# Patient Record
Sex: Female | Born: 1980 | Race: White | Hispanic: No | Marital: Single | Smoking: Former smoker
Health system: Southern US, Community
[De-identification: ages and names within clinical notes are randomized; demographics above are authoritative.]

## PROBLEM LIST (undated history)

## (undated) DIAGNOSIS — G43909 Migraine, unspecified, not intractable, without status migrainosus: Secondary | ICD-10-CM

## (undated) DIAGNOSIS — E669 Obesity, unspecified: Secondary | ICD-10-CM

## (undated) DIAGNOSIS — H04123 Dry eye syndrome of bilateral lacrimal glands: Secondary | ICD-10-CM

## (undated) DIAGNOSIS — F419 Anxiety disorder, unspecified: Secondary | ICD-10-CM

## (undated) DIAGNOSIS — F410 Panic disorder [episodic paroxysmal anxiety] without agoraphobia: Secondary | ICD-10-CM

## (undated) DIAGNOSIS — K859 Acute pancreatitis without necrosis or infection, unspecified: Secondary | ICD-10-CM

## (undated) DIAGNOSIS — G589 Mononeuropathy, unspecified: Secondary | ICD-10-CM

## (undated) DIAGNOSIS — E662 Morbid (severe) obesity with alveolar hypoventilation: Secondary | ICD-10-CM

## (undated) DIAGNOSIS — R0600 Dyspnea, unspecified: Secondary | ICD-10-CM

## (undated) HISTORY — DX: Morbid (severe) obesity due to excess calories: E66.01

## (undated) HISTORY — PX: EYE SURGERY: SHX253

## (undated) HISTORY — DX: Morbid (severe) obesity with alveolar hypoventilation: E66.2

## (undated) HISTORY — DX: Migraine, unspecified, not intractable, without status migrainosus: G43.909

## (undated) HISTORY — DX: Dry eye syndrome of bilateral lacrimal glands: H04.123

## (undated) HISTORY — DX: Obesity, unspecified: E66.9

---

## 2002-08-24 HISTORY — PX: OTHER SURGICAL HISTORY: SHX169

## 2002-08-24 HISTORY — PX: FRACTURE SURGERY: SHX138

## 2002-09-23 ENCOUNTER — Inpatient Hospital Stay (HOSPITAL_COMMUNITY): Admission: EM | Admit: 2002-09-23 | Discharge: 2002-09-26 | Payer: Self-pay | Admitting: Emergency Medicine

## 2002-09-23 ENCOUNTER — Encounter: Payer: Self-pay | Admitting: Emergency Medicine

## 2002-09-23 ENCOUNTER — Encounter: Payer: Self-pay | Admitting: Orthopedic Surgery

## 2002-11-23 ENCOUNTER — Ambulatory Visit (HOSPITAL_COMMUNITY): Admission: RE | Admit: 2002-11-23 | Discharge: 2002-11-23 | Payer: Self-pay | Admitting: Orthopedic Surgery

## 2002-11-28 ENCOUNTER — Encounter: Admission: RE | Admit: 2002-11-28 | Discharge: 2003-02-26 | Payer: Self-pay | Admitting: Orthopedic Surgery

## 2003-02-27 ENCOUNTER — Encounter: Admission: RE | Admit: 2003-02-27 | Discharge: 2003-03-15 | Payer: Self-pay | Admitting: Orthopedic Surgery

## 2003-09-26 ENCOUNTER — Emergency Department (HOSPITAL_COMMUNITY): Admission: EM | Admit: 2003-09-26 | Discharge: 2003-09-26 | Payer: Self-pay

## 2003-09-28 ENCOUNTER — Inpatient Hospital Stay (HOSPITAL_COMMUNITY): Admission: EM | Admit: 2003-09-28 | Discharge: 2003-10-06 | Payer: Self-pay | Admitting: Emergency Medicine

## 2003-10-11 ENCOUNTER — Ambulatory Visit (HOSPITAL_COMMUNITY): Admission: RE | Admit: 2003-10-11 | Discharge: 2003-10-11 | Payer: Self-pay | Admitting: Internal Medicine

## 2003-11-06 ENCOUNTER — Encounter (INDEPENDENT_AMBULATORY_CARE_PROVIDER_SITE_OTHER): Payer: Self-pay | Admitting: *Deleted

## 2003-11-06 ENCOUNTER — Ambulatory Visit (HOSPITAL_COMMUNITY): Admission: RE | Admit: 2003-11-06 | Discharge: 2003-11-07 | Payer: Self-pay | Admitting: Surgery

## 2004-05-05 ENCOUNTER — Emergency Department (HOSPITAL_COMMUNITY): Admission: EM | Admit: 2004-05-05 | Discharge: 2004-05-05 | Payer: Self-pay | Admitting: *Deleted

## 2004-08-24 DIAGNOSIS — K859 Acute pancreatitis without necrosis or infection, unspecified: Secondary | ICD-10-CM

## 2004-08-24 HISTORY — PX: CHOLECYSTECTOMY: SHX55

## 2004-08-24 HISTORY — DX: Acute pancreatitis without necrosis or infection, unspecified: K85.90

## 2005-01-21 ENCOUNTER — Emergency Department (HOSPITAL_COMMUNITY): Admission: EM | Admit: 2005-01-21 | Discharge: 2005-01-21 | Payer: Self-pay | Admitting: Family Medicine

## 2005-01-29 ENCOUNTER — Emergency Department (HOSPITAL_COMMUNITY): Admission: EM | Admit: 2005-01-29 | Discharge: 2005-01-29 | Payer: Self-pay | Admitting: Emergency Medicine

## 2005-02-23 ENCOUNTER — Emergency Department (HOSPITAL_COMMUNITY): Admission: EM | Admit: 2005-02-23 | Discharge: 2005-02-23 | Payer: Self-pay | Admitting: Emergency Medicine

## 2005-02-27 ENCOUNTER — Emergency Department (HOSPITAL_COMMUNITY): Admission: EM | Admit: 2005-02-27 | Discharge: 2005-02-27 | Payer: Self-pay | Admitting: Family Medicine

## 2005-03-08 ENCOUNTER — Emergency Department (HOSPITAL_COMMUNITY): Admission: EM | Admit: 2005-03-08 | Discharge: 2005-03-08 | Payer: Self-pay | Admitting: Emergency Medicine

## 2005-05-12 IMAGING — CT CT ABDOMEN W/ CM
1 series · 16 of 32 positions shown, 20 images · IV contrast (omnipaque)
Comparison: none

CLINICAL DATA: Left lower quadrant pain.  
 CT ABDOMEN WITH CONTRAST  
 150 cc Omnipaque 300 IV.  Oral contrast was also administered.  No comparison.  
 The liver, spleen, and kidneys are normal.  The pancreas is diffusely swollen.  There is some edema in the mesentery around the pancreas, particularly around the head of the pancreas.  There is some retroperitoneal fluid bilaterally.  The findings suggest acute pancreatitis.  There is no pseudocyst.
 IMPRESSION
 Pancreatic swelling with peripancreatic fluid suggesting acute pancreatitis. 
 CT PELVIS WITH CONTRAST
 There is a small amount of free fluid in the pelvis.  There is no bowel thickening or adenopathy.  The bowel is nondilated.
 Free fluid in the pelvis.

[Series 2: routine abdomen · axial · 0.98mm/px · z∈[-517,-87]mm · 16 of 127 slices shown, 20 images]
[im 9/127  soft-tissue]
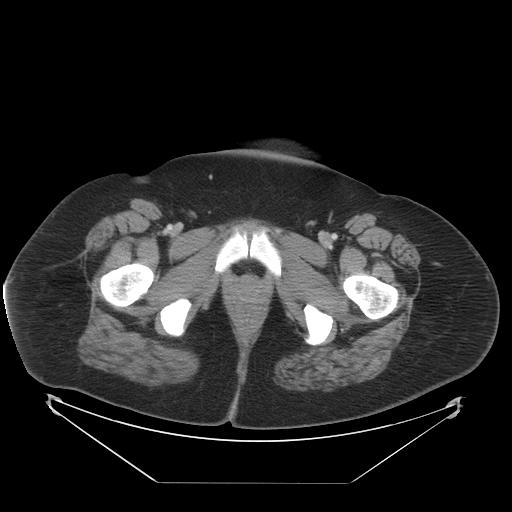
[im 9/127  bone]
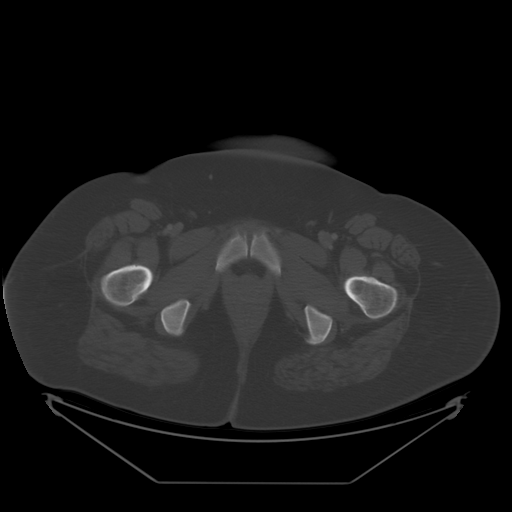
[im 17/127  soft-tissue]
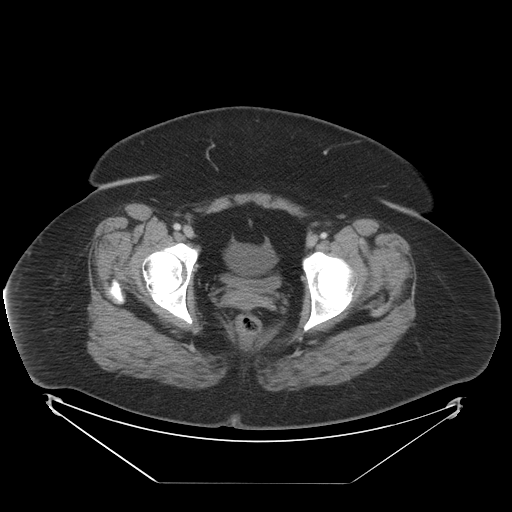
[im 25/127  soft-tissue]
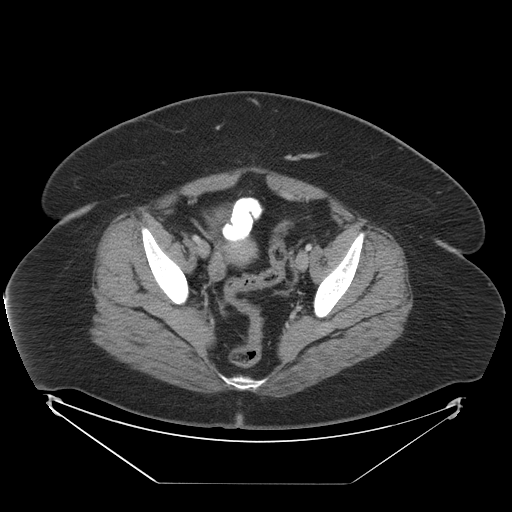
[im 33/127  soft-tissue]
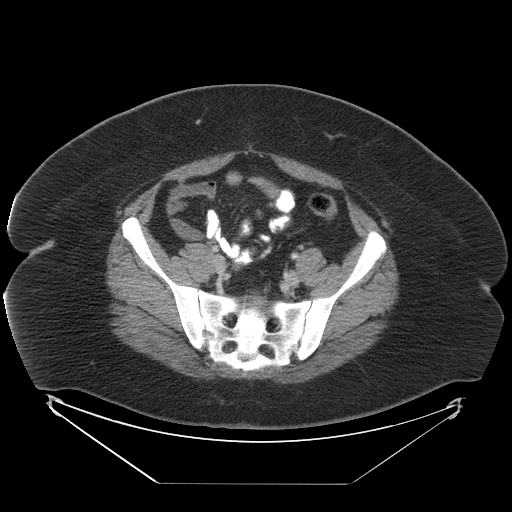
[im 41/127  soft-tissue]
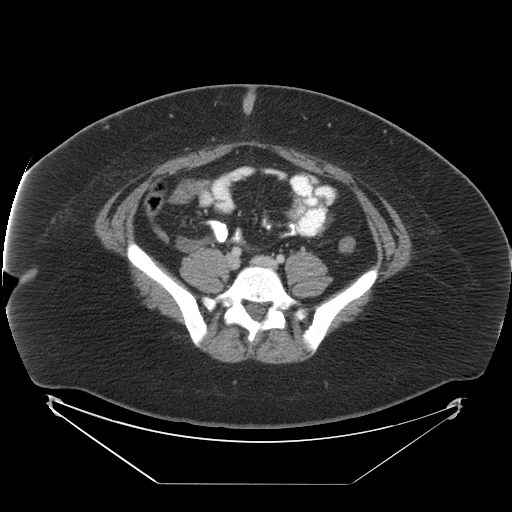
[im 49/127  soft-tissue]
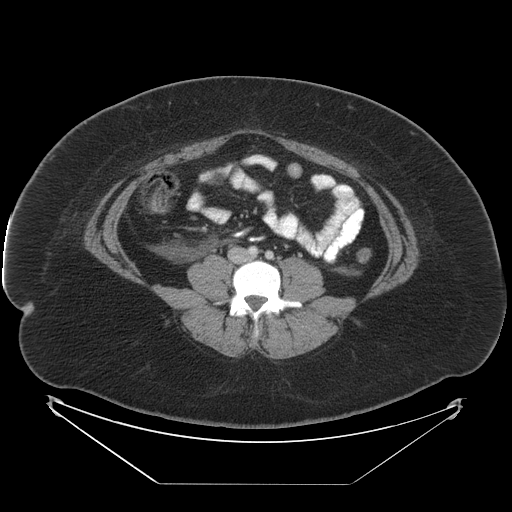
[im 57/127  soft-tissue]
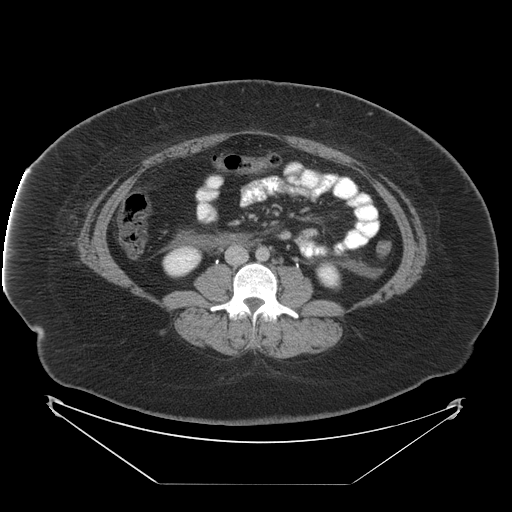
[im 70/127  soft-tissue]
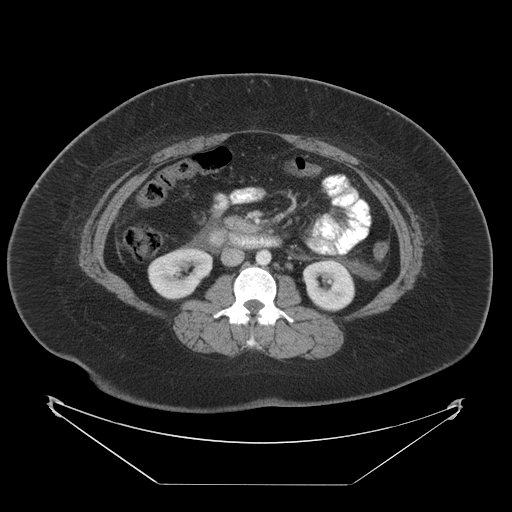
[im 78/127  soft-tissue]
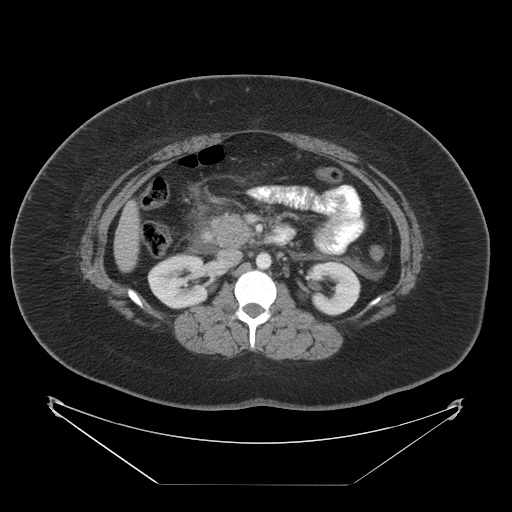
[im 78/127  bone]
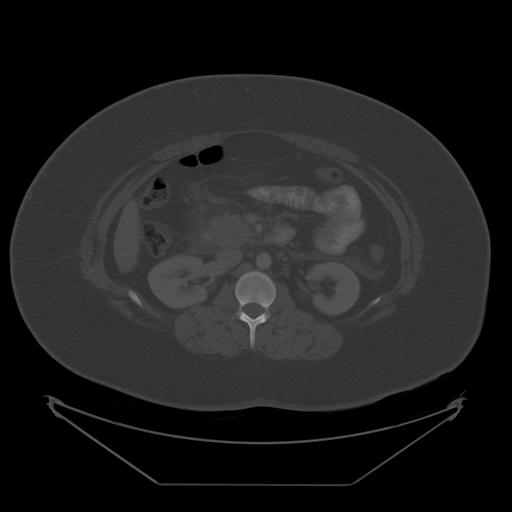
[im 86/127  soft-tissue]
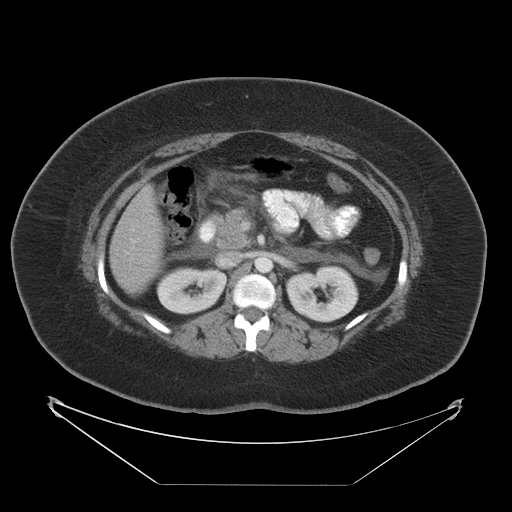
[im 94/127  soft-tissue]
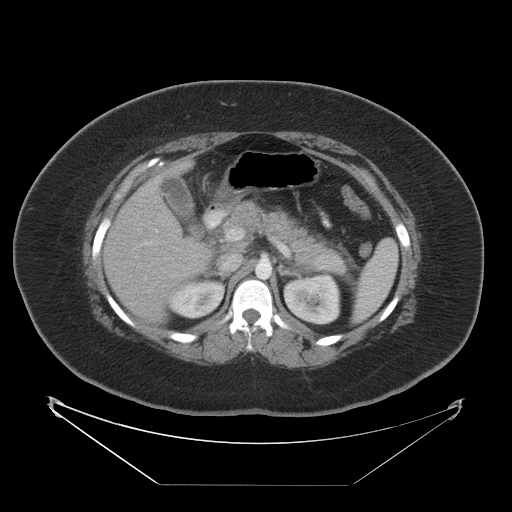
[im 102/127  soft-tissue]
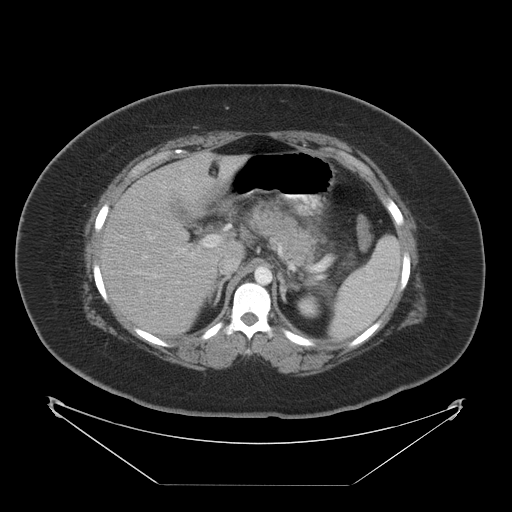
[im 110/127  soft-tissue]
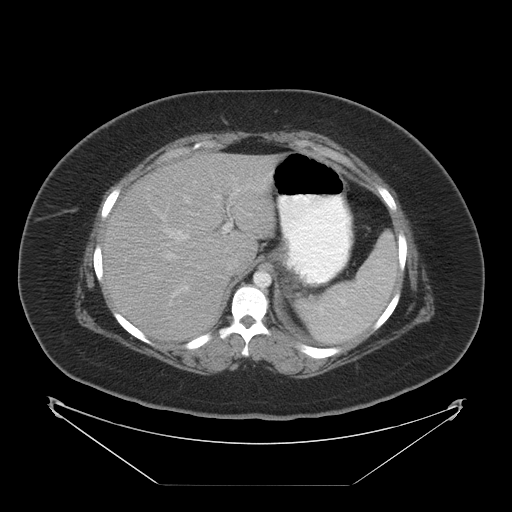
[im 110/127  lung]
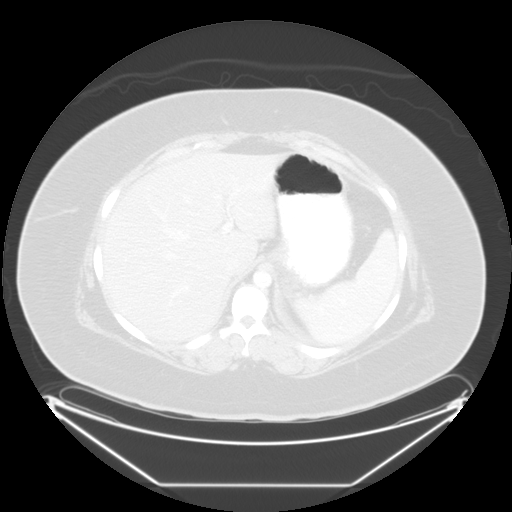
[im 114/127  lung]
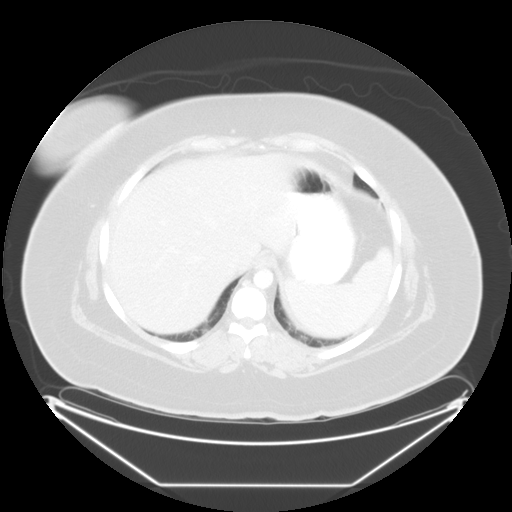
[im 118/127  soft-tissue]
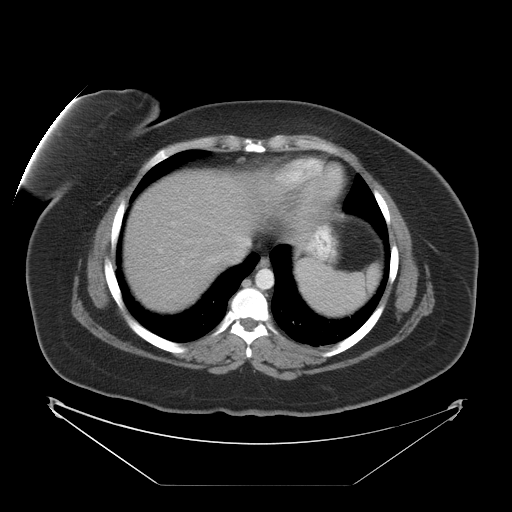
[im 118/127  lung]
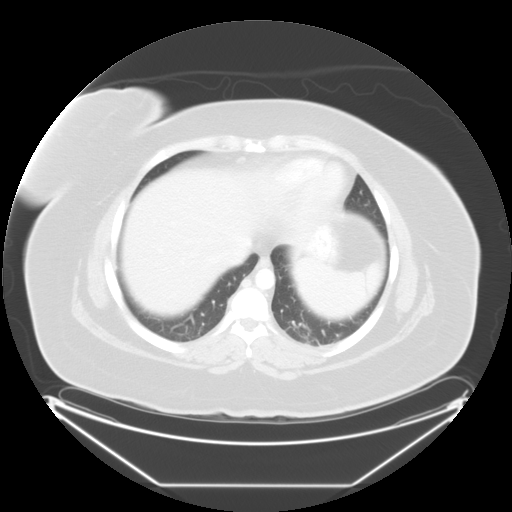
[im 122/127  lung]
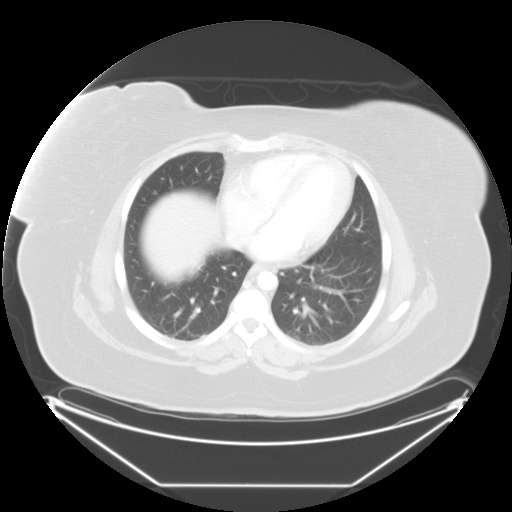

[16 of 32 positions shown; findings below may reference images not displayed]

## 2005-05-13 IMAGING — US US ABDOMEN COMPLETE
1 series · 14 of 25 positions shown · non-contrast
Comparison: none

CLINICAL DATA: Pancreatitis.  Question gallstones. 
ABDOMINAL SONOGRAM

[Series 1: unknown · 0.38mm/px · 14 of 49 slices shown]
[im 1/49]
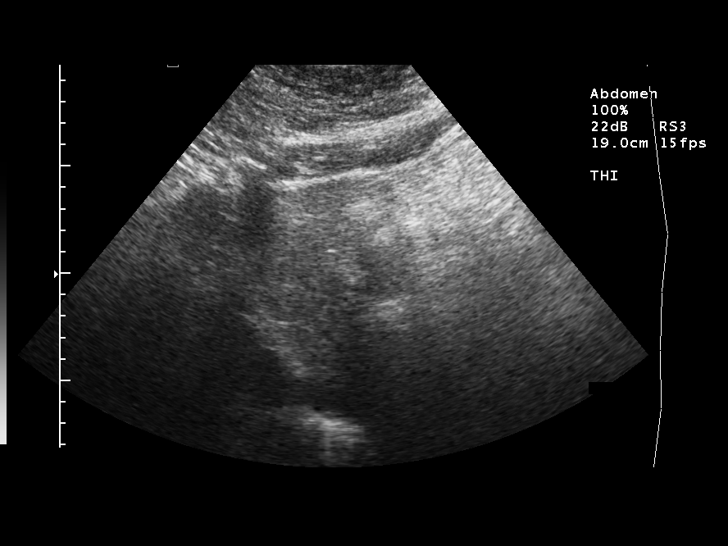
[im 5/49]
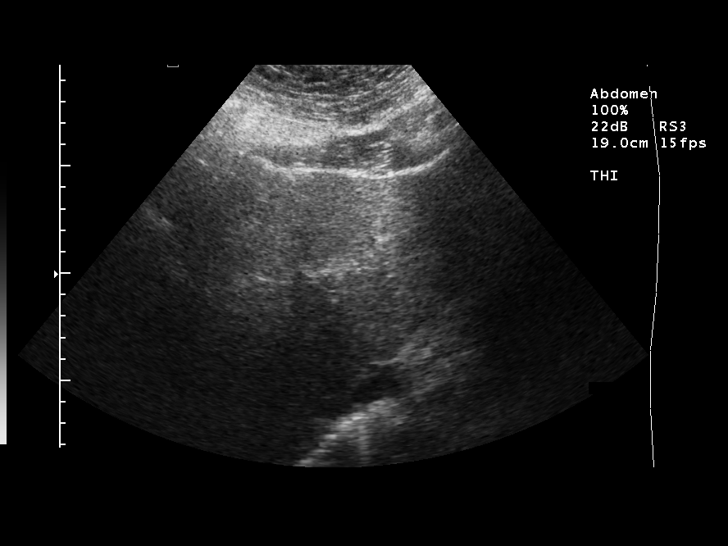
[im 9/49]
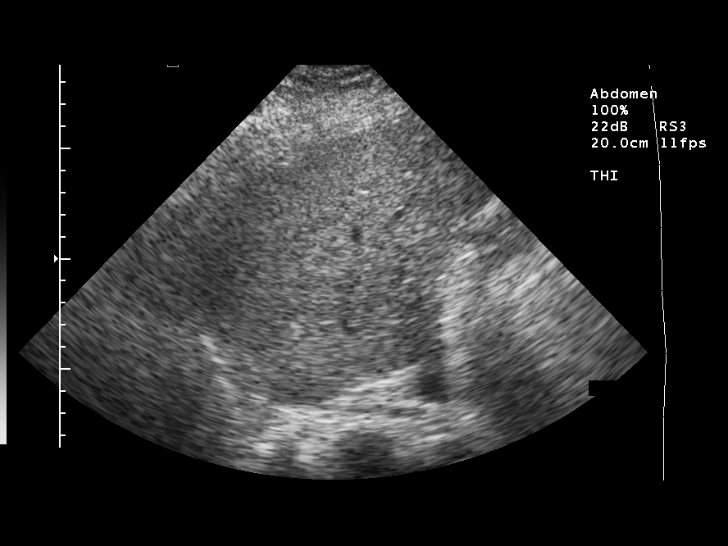
[im 13/49]
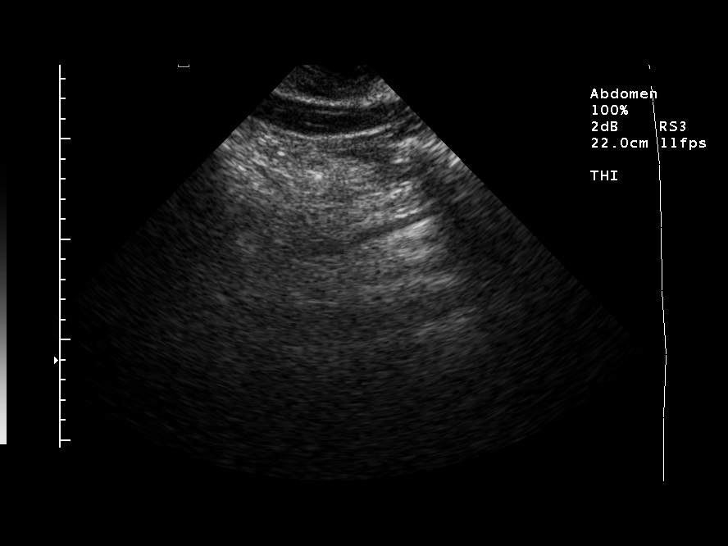
[im 17/49]
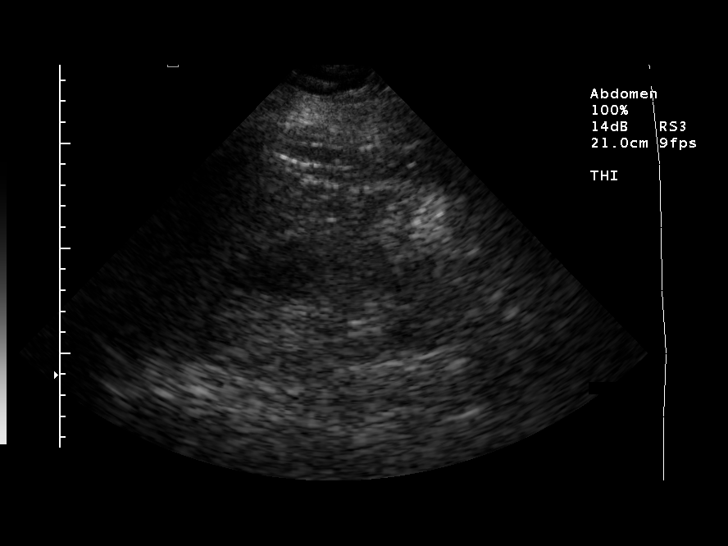
[im 19/49]
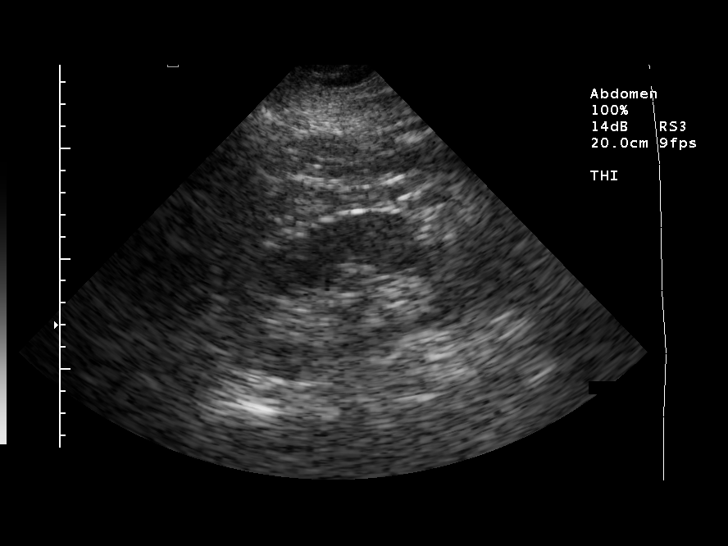
[im 23/49]
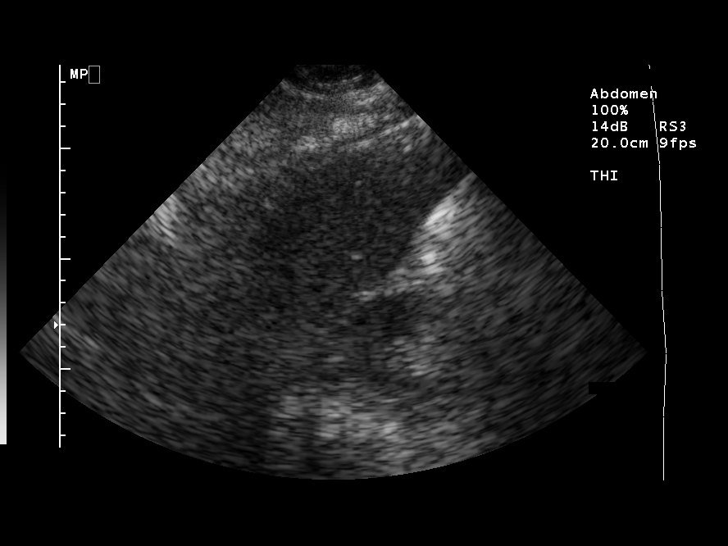
[im 27/49]
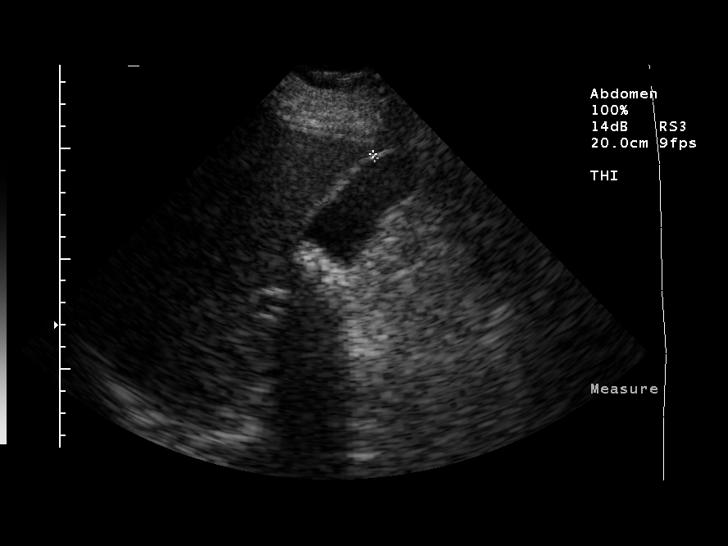
[im 31/49]
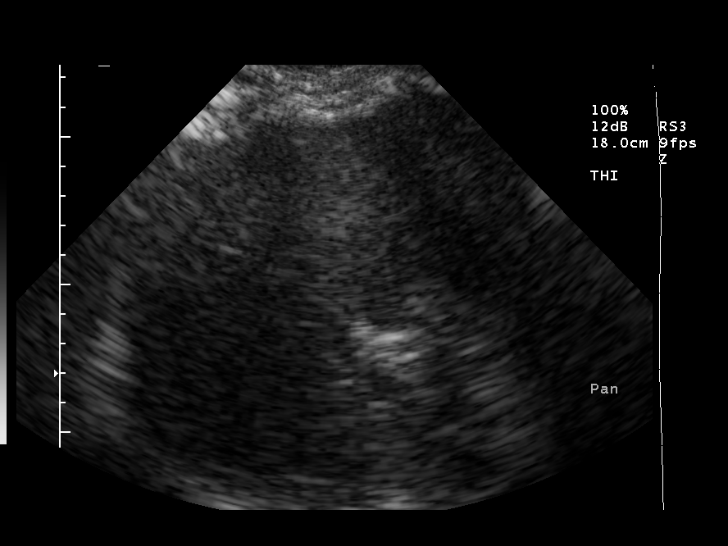
[im 33/49]
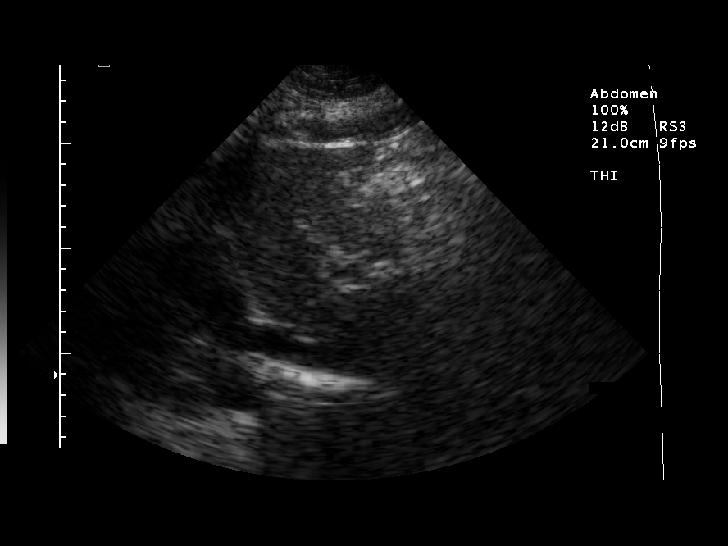
[im 37/49]
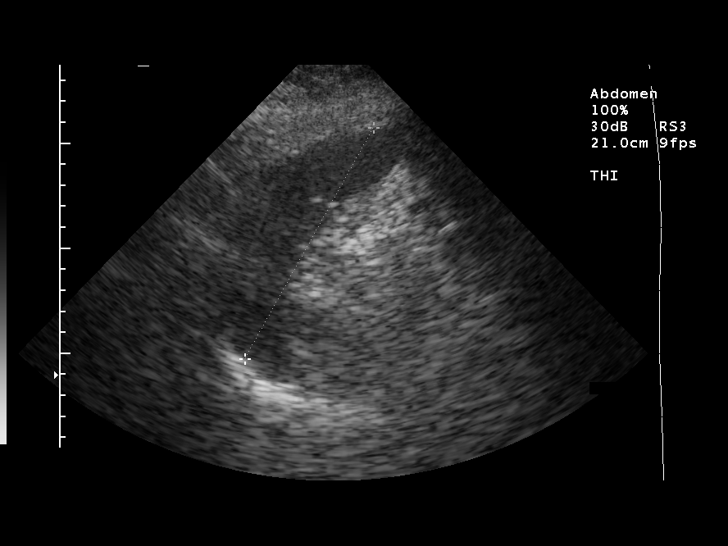
[im 41/49]
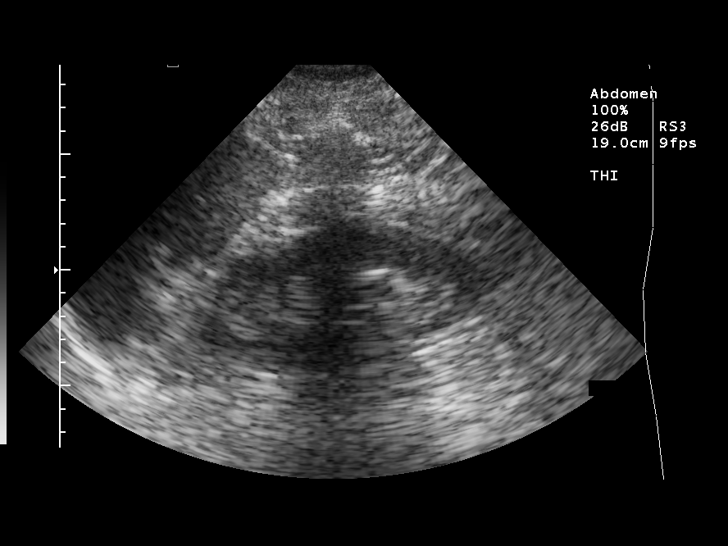
[im 45/49]
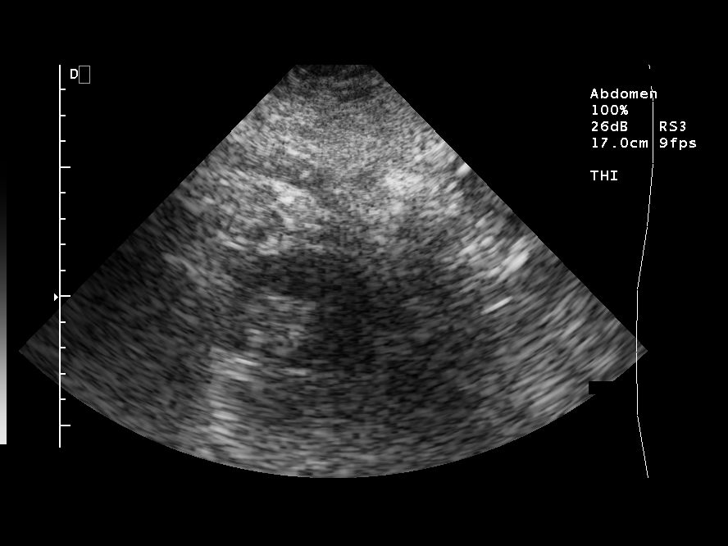
[im 49/49]
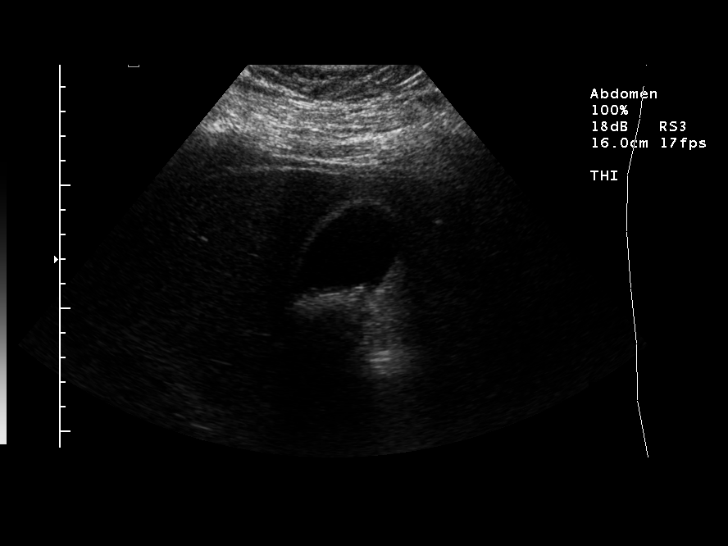

[14 of 25 positions shown; findings below may reference images not displayed]

FINDINGS: Fatty infiltration of the liver without focal hepatic lesion or intrahepatic duct dilatation.  Multiple small mobile gallstones without gallbladder wall thickening with gallbladder wall measuring 2.0 cm.  No pericholecystic fluid detected.  It is possible that a small stone entered the common bile duct system contributing to the patient?s pancreatitis, however, this is not demonstrated on the present exam.  The common bile duct measures 4.4 mm.  
Because of overlying bowel gas, there is markedly limited evaluation of the pancreas and abdominal aorta as well as inferior vena cava. 
The right kidney measures 11.3 cm and the left kidney 11.9 cm without focal mass or hydronephrosis.  The spleen is unremarkable.
IMPRESSION
Multiple small mobile gallstones without gallbladder wall thickening.  Please see above.

## 2005-07-27 ENCOUNTER — Encounter: Admission: RE | Admit: 2005-07-27 | Discharge: 2005-07-27 | Payer: Self-pay | Admitting: Orthopedic Surgery

## 2005-10-19 ENCOUNTER — Emergency Department (HOSPITAL_COMMUNITY): Admission: EM | Admit: 2005-10-19 | Discharge: 2005-10-19 | Payer: Self-pay | Admitting: Family Medicine

## 2006-06-10 ENCOUNTER — Emergency Department (HOSPITAL_COMMUNITY): Admission: EM | Admit: 2006-06-10 | Discharge: 2006-06-10 | Payer: Self-pay | Admitting: Emergency Medicine

## 2006-08-04 ENCOUNTER — Emergency Department (HOSPITAL_COMMUNITY): Admission: EM | Admit: 2006-08-04 | Discharge: 2006-08-04 | Payer: Self-pay | Admitting: Emergency Medicine

## 2008-01-06 ENCOUNTER — Emergency Department (HOSPITAL_COMMUNITY): Admission: EM | Admit: 2008-01-06 | Discharge: 2008-01-06 | Payer: Self-pay | Admitting: Family Medicine

## 2008-06-17 ENCOUNTER — Emergency Department (HOSPITAL_COMMUNITY): Admission: EM | Admit: 2008-06-17 | Discharge: 2008-06-17 | Payer: Self-pay | Admitting: Emergency Medicine

## 2008-10-30 ENCOUNTER — Emergency Department (HOSPITAL_COMMUNITY): Admission: EM | Admit: 2008-10-30 | Discharge: 2008-10-30 | Payer: Self-pay | Admitting: Emergency Medicine

## 2010-06-27 ENCOUNTER — Emergency Department (HOSPITAL_COMMUNITY): Admission: EM | Admit: 2010-06-27 | Discharge: 2010-06-27 | Payer: Self-pay | Admitting: Emergency Medicine

## 2010-12-04 LAB — URINALYSIS, ROUTINE W REFLEX MICROSCOPIC
Bilirubin Urine: NEGATIVE
Hgb urine dipstick: NEGATIVE
Ketones, ur: NEGATIVE mg/dL
Protein, ur: NEGATIVE mg/dL
Urobilinogen, UA: 0.2 mg/dL (ref 0.0–1.0)

## 2010-12-04 LAB — POCT I-STAT, CHEM 8
BUN: 8 mg/dL (ref 6–23)
Chloride: 104 mEq/L (ref 96–112)
Creatinine, Ser: 0.9 mg/dL (ref 0.4–1.2)
Potassium: 3.9 mEq/L (ref 3.5–5.1)
Sodium: 140 mEq/L (ref 135–145)

## 2010-12-04 LAB — POCT CARDIAC MARKERS
CKMB, poc: <1 ng/mL — ABNORMAL LOW (ref 1.0–8.0)
Myoglobin, poc: 41.2 ng/mL (ref 12–200)

## 2011-01-09 NOTE — Op Note (Signed)
[name], French                         ACCOUNT NO.:  [id number]   MEDICAL RECORD NO.:  [id number]                   PATIENT TYPE:  OIB   LOCATION:  6122                                 FACILITY:  [facility]   PHYSICIAN:  Thornton Park. Daphine Deutscher, M.D.             DATE OF BIRTH:  1980/10/21   DATE OF PROCEDURE:  11/06/2003  DATE OF DISCHARGE:  11/07/2003                                 OPERATIVE REPORT   PREOPERATIVE DIAGNOSIS:  Biliary pancreatitis from gallstones.   POSTOPERATIVE DIAGNOSIS:  Biliary pancreatitis from gallstones.  Status post  resolution of pancreatitis.   OPERATION PERFORMED:  Laparoscopic cholecystectomy with intraoperative  cholangiogram.   SURGEON:  Thornton Park. Daphine Deutscher, M.D.   ASSISTANT:  Velora Heckler, M.D.   ANESTHESIA:  General endotracheal.   INDICATIONS FOR PROCEDURE:  Sarah French is a 30 year old lady who had been  previously admitted by the teaching service with biliary pancreatitis.  She  was seen back in the office at which time informed consent was obtained  regarding laparoscopy as well as open cholecystectomy.   DESCRIPTION OF PROCEDURE:  She was taken back to room 16 on November 06, 2003  and given general anesthesia.  The abdomen was prepped with Betadine and  draped sterilely.  A longitudinal incision was made down into her umbilicus  and a Hasson cannula was inserted without difficulty. The abdomen was  insufflated and three ports were placed in the upper abdomen.  The  gallbladder was grasped, elevated and I stripped away adhesions to the  fundus and infundibulum.  Went down to dissect out a lot of adhesions to the  Calot's triangle but was able to identify her cystic duct, cystic artery and  clipped the cystic artery and put a clip up on the gallbladder.  I incised  the cystic duct and inserted a Reddick catheter and took a dynamic  cholangiogram which showed filling of the common duct, intrahepatic ducts  and flow into the duodenum.  We used  the old Siemens' C-arms.  They had  difficulty saving images but this to my dynamic interpretation showed the  anatomy adequately and did not show any evidence of distal common duct  obstruction.  However, I was unable to discern a pancreatic duct at reflux.   The cystic duct was then triple clipped, divided.  Cystic artery was triple  clipped, divided and the gallbladder was removed with the hook  electrocautery without entering it.  Posterior branches were clipped and  then the gallbladder was detached from the gallbladder bed and it was  brought out in a bag through the umbilicus.  I went in and looked at the  gallbladder bed.  No bleeding or bile leaks were noted.  Umbilical port was  prepared with a figure-of-eight suture of 0 Vicryl.  The patient seemed to  tolerate the procedure well and was taken to the recovery room in  satisfactory condition.  Thornton Park Daphine Deutscher, M.D.    MBM/MEDQ  D:  11/06/2003  T:  11/07/2003  Job:  [id number]

## 2011-01-09 NOTE — Discharge Summary (Signed)
Sarah French, Sarah French                         ACCOUNT NO.:  [id number]   MEDICAL RECORD NO.:  [id number]                   PATIENT TYPE:  INP   LOCATION:  6713                                 FACILITY:  [facility]   PHYSICIAN:  Sarah Asal, MD             DATE OF BIRTH:  1981-05-18   DATE OF ADMISSION:  09/27/2003  DATE OF DISCHARGE:  10/06/2003                                 DISCHARGE SUMMARY   The patient does not have a continuity doctor.  She sees Sarah French p.r.n.   CONSULTANTS:  Sarah French of surgery.   DISCHARGE DIAGNOSES:  1. Pancreatitis.  2. Obesity.  3. Gallstones.  4. Gastroesophageal reflux disease.  5. History of urinary tract infection.  6. Status post repair of ankle fracture in the past.   DISCHARGE MEDICATIONS:  1. Phenergan 25-mg tabs, 1 tab q.6h p.r.n. nausea.  2. Scopolamine patch applied behind the ear for 72 hours p.r.n. nausea.  3. Allegra D one tab p.o. daily.   DISPOSITION AND FOLLOWUP:  1. The patient has an appointment for a CT scan scheduled for February 17 at     11 o'clock.  2. The patient has an appointment scheduled with Dr. Daphine French at [facility].  The patient was to call to schedule that appointment     approximately 1 week after discharge.  3. The patient was told to call [phone] to follow up with Dr. Laurell French approximately 3 to 4 weeks after discharge.   PROCEDURES PERFORMED:  1. MRCP which showed multiple 1 to 3-mm gallstones independently in the     gallbladder neck.  No sign of stone obstruction in the ductal system.  No     ductal dilation.  Did show a retroperitoneal edema, particularly on the     left consistent with resolving pancreatitis.  No evidence of pancreatic     pseudocyst.  Also showed small pleural effusions and a small amount of     ascites.  That was done on October 02, 2003.  2. Abdominal ultrasound done on February 4 showed multiple small gallstones     without  gallbladder Asselin thickening.  3. CT of the pelvis with contrast was done on February 3 which showed free     fluid in the pelvis.  CT of the abdomen was also done on February 3 which     once again showed free fluid in the pelvis.  The patient had chest x-rays     on February 2 and February 5 which showed no active disease but later a     left-sided subpulmonic effusion.   CONSULTATIONS:  The patient was seen by Dr. Luretha French of surgery to  evaluate her for possible cholecystectomy.   HISTORY AND PHYSICAL:  She is a 30 year old morbidly obese female with no  significant past medical history who presented to the  ER on August 26, 2003,  complaining of chest and back pain.  Her lipase was normal at that time. She  was discharged home with a script for Darvocet and Flexeril.  She returned  on the day of admission, September 27, 2003, to the emergency department  complaining of stomach pain, sharp in quality, rated at a 10 out of 10,  constant with no aggravating factors.  Minimal alleviation with pain  medications that she had been getting. She had no recent fever, chills,  weight changes.  A CT obtained in the emergency department showed  pancreatitis. She did have nausea and vomiting.  No diarrhea, no melena, no  hematochezia.  She had had a decreased appetite in the past 24 hours prior  to  admission.   LABORATORY DATA:  At the time of admission her labs showed a white count of  11.4, a hemoglobin 13.5, platelets 269,000, ANC 9.8, MCV 87.2.  Sodium of  140, potassium 4, chloride 107, CO2 of 29, BUN 11, creatinine 0.8, glucose  115, total bili of 0.8, alk phos 53, AST 51, ALT 31, total protein 6.2,  albumin of 3.7, and calcium of 8.8.  The labs just mentioned were from  September 26, 2003, the date prior to admission.   The day of admission she had a lipase of 1498.  She had a sodium of 136,  potassium 3.9, chloride 105, CO2 of 27, glucose 122, BUN 8, creatinine 0.7,  total bili 1.4,  alk phos of 70, AST of 238, ALT of 315, total protein of  5.8, albumin 3.4, and calcium 8.4.  She also had a white count of 11.4  hemoglobin 13.5, platelets of 269,000.  She had no fecal occult blood at the  time of admission.  She also had a UA which showed 3 to 5 white blood cells,  many bacteria, and moderate leukocyte esterase at the time of admission.   HOSPITAL COURSE:  Problem #1:  Pancreatitis.  At the time of admission this  was not thought to be due to alcohol since she does not have a heavy alcohol  use history.  We also did not think at the beginning that it was due to  gallstones because there were no obstructing stones seen on scan, although  there were multiple stones seen in the gallbladder.  Given her marked  obesity, we at first thought that she might have hypertriglyceridemia, so we  checked that.  When she first came in, she was placed n.p.o. and her pain  was controlled.  She had no _____________ factors, so her chance of  mortality from her pancreatitis was 0.9% when she first came in.   Over the course of her hospital stay, she continued to have significant pain  issues.  She was relieved Dilaudid for awhile and then was transitioned to  morphine. Her lipase continued to trend down but for many days she continued  to have significant epigastric tenderness.  Upon further  reflection, it was  thought that perhaps her pancreatitis was due to gallstones as her  triglycerides came back as 58.  So, this was not a cause, although she did  have multiple, multiple gallstones seen in the gallbladder.  It was thought  that she probably did have an obstruction and that it had passed.  MRCP was  performed to insure that there was no sludging or stone that was not seen on  plain film and CT, and no stone or obstruction was ever found  on MRCP.   An acute hepatitis panel was also done which was negative.  H. pylori was negative.  Surgery was consulted for possible cholecystectomy.   Dr. Daphine French  saw the patient and recommended to do laparoscopic cholecystectomy when the  edema surrounding her pancreas had resolved.  He recommended repeating a CT  scan approximately a week after pain had resolved and proceeding from there  with an outpatient elective laparoscopic cholecystectomy.   On October 05, 2003, approximately 8 days into the hospital stay, the  patient's pain began to resolve and she began to get her appetite back.  Her  diet was advanced and she was tolerating liquids at the time of discharge on  October 06, 2003.  Nutrition was consulted and per their recommendation,  and per the recommendation of Dr. Daphine French, the patient will be kept on a low-  fat liquid diet until such time as she can have her laparoscopic  cholecystectomy.   Problem #2:  Urinary tract infection.  The patient was given IV Cipro for 3  days which treated that and had no other complications ith her urinary tract  infection during the course of her hospital stay.   Problem #3:  Morbid obesity.  The patient's weight was discussed with her  both by myself and with Dr. Daphine French.  She desires to lose weight, but does  not really know how to do so and has not tried very many things in the past.  We recommended nutrition to see her on an outpatient basis and help her with  diet and lifestyle changes.   Problem #4:  Acute sinusitis.  The patient developed acute sinusitis during  this hospitalization and was covered with Zosyn and Afrin for 3 days after  which that resolved after a 3-day course.   Problem #5:  Diarrhea.  The patient did develop diarrhea during the course  of this hospitalization.  A C. diff was checked and was negative.  The  patient was afebrile and never mounted a white count.  It was thought that  her diarrhea was likely secondary to multiple factors including the liquid  diet that she had and her pancreatitis and was resolving at the time of  discharge.   Labs at the time of  discharge included CBC with a white count of 6.6,  hemoglobin 10, hematocrit 30, platelet count 314,000.  Sodium 142, potassium  3.6, chloride 106, CO2 of 29, glucose 109, BUN 5, creatinine 0.7, and  calcium of 8.2.                                                Sarah Asal, MD    CW/MEDQ  D:  10/09/2003  T:  10/09/2003  Job:  [id number]

## 2011-01-09 NOTE — Discharge Summary (Signed)
   [name], French                         ACCOUNT NO.:  [id number]   MEDICAL RECORD NO.:  [id number]                   PATIENT TYPE:  INP   LOCATION:  5019                                 FACILITY:  [facility]   PHYSICIAN:  Burnard Bunting, M.D.                 DATE OF BIRTH:  05/03/1981   DATE OF ADMISSION:  09/22/2002  DATE OF DISCHARGE:  09/26/2002                                 DISCHARGE SUMMARY   DISCHARGE DIAGNOSES:  Left ankle fracture dislocation.   SECONDARY DIAGNOSIS:  None.   HISTORY OF PRESENT ILLNESS:  Sarah French is a 30 year old patient who  slipped on the ice on the day of admission. Complains of left ankle pain.  Denies any other orthopedic complaints.   HOSPITAL COURSE:  The patient was admitted to the orthopedic service. She  underwent left ankle reduction with open reduction internal fixation of the  fibula and syndesmosis on 09/23/02. She tolerated the procedure well without  any complications. She was transferred to the recovery room in stable  condition. She was mobilized with physical therapy and started on Coumadin  for DVT prophylaxis. Toes were sensate and perfused. Postoperative x-rays  showed good reduction of the fracture in the mortis. She was slow to  mobilize with physical therapy. She was off the IV pain medications on  09/25/02. She was seen by physical therapy on all days of hospitalization and  was safe for discharge on 09/26/02. She will follow up with me in one week to  check the incision.   DISCHARGE MEDICATIONS:  1. Coumadin.  2. Percocet.  3. Robaxin.   ACTIVITY:  She will maintain a nonweightbearing status for approximately two  to three months. She will require syndesmotic screw removal in the future.                                               Burnard Bunting, M.D.    GSD/MEDQ  D:  10/24/2002  T:  10/25/2002  Job:  [id number]

## 2011-01-09 NOTE — Op Note (Signed)
NAME:  Sarah French, Sarah French                         ACCOUNT NO.:  [id number]   MEDICAL RECORD NO.:  [id number]                   PATIENT TYPE:  INP   LOCATION:  5019                                 FACILITY:  [facility]   PHYSICIAN:  Burnard Bunting, M.D.                 DATE OF BIRTH:  [birth year]   DATE OF PROCEDURE:  09/23/2002  DATE OF DISCHARGE:                                 OPERATIVE REPORT   PREOPERATIVE DIAGNOSIS:  Left ankle fracture dislocation with disruption of  syndesmosis in the lateral malleolar fracture.   POSTOPERATIVE DIAGNOSIS:  Left ankle fracture dislocation with disruption of  syndesmosis in the lateral malleolar fracture.   PROCEDURE:  1. Left ankle closed reduction of dislocation.  2. Open reduction and internal fixation of lateral malleolus fracture.  3. Open reduction and internal fixation of syndesmosis.   SURGEON:  Burnard Bunting, M.D.   ANESTHESIA:  General endotracheal.   ESTIMATED BLOOD LOSS:  50 mL.   DRAINS:  None.   TOURNIQUET TIME:  Approximately one hour at 300 mmHg.   PROCEDURE IN DETAIL:  The patient was brought to the operating room where  general endotracheal anesthesia was induced.  Preoperative antibiotics were  administered.  The left foot was prepped with Duraprep solution and draped  in a sterile manner.  The fracture was reduced with a combination of  traction and inward/outward reduction.  The ankle fracture was reduced.  The  medial malleolus was intact.  The ankle was highly unstable.  At this time  the leg was elevated and exsanguinated with the Esmarch wrap and tourniquet  was inflated.  The operative field was covered with Ioban.  A lateral  approach to the malleolus was utilized.  The skin and subcutaneous tissue  were sharply divided.  The superficial peroneal nerve was identified.  The  posterior branch crossing the acetabula was mobilized and protected.  A lag  screw was placed across the distal fracture fragment which had a  coronal  split.  This gave excellent, turned the two distal pieces into one solid  fragment.  There was significant comminution in the region slightly superior  to the syndesmosis.  A rotational alignment of the distal fragment was  difficult to ascertain because of the comminution proximally.  Under  fluoroscopic guidance the mortise was recreated and the fibular length was  restored.  A one-third tibial locking plate was then applied with good  fixation achieved in the distal and proximal fragments.  At this time the  syndesmosis was highly unstable.  The syndesmosis was reduced and two 3.5  cortical screws were placed in the proximal syndesmosis.  Proximal holes  were over drilled with 3.5 drill bit.  Good compression and restoration of  the mortise was maintained.  At this time ankle was taken through a range of  motion and found to have good ankle dorsal flexion with stable  fracture  alignment.  The tourniquet was released, the  incision was irrigated thoroughly.  The incision was then closed using  interrupted inverted 2-0 Vicryl sutures followed by running 3-0 pullout  Prolene.  The patient was placed in a posterior splint.  She tolerated the  procedure well without immediate complications.                                               Burnard Bunting, M.D.    GSD/MEDQ  D:  09/24/2002  T:  09/24/2002  Job:  [id number]

## 2011-01-09 NOTE — Consult Note (Signed)
NAMELORICE, LAFAVE                         ACCOUNT NO.:  [id number]   MEDICAL RECORD NO.:  [id number]                   PATIENT TYPE:  INP   LOCATION:  6713                                 FACILITY:  [facility]   PHYSICIAN:  Thornton Park. Daphine Deutscher, M.D.             DATE OF BIRTH:  1981/01/13   DATE OF CONSULTATION:  10/03/2003  DATE OF DISCHARGE:                                   CONSULTATION   REASON FOR CONSULTATION:  Biliary pancreatitis.   HISTORY:  This is a 30 year old, morbidly obese, white female who was  referred in with abdominal pain.  She had had a previous bout of abdominal  pain about a month ago, August 26, 2003, when she was seen in the emergency  room with chest and back pain.  She had a lipase that was normal at that  time.  She was seen in the ED on September 27, 2003, with abdominal pain, but  no fever or chills.  CT scan obtained in the emergency department was  consistent with pancreatitis.  The patient complained of nausea and  vomiting, but no diarrhea.   ALLERGIES:  1. The patient gets a rash with AZITHROMYCIN.  2. Rash with PERCOCET.   PAST MEDICAL HISTORY:  1. Morbid obesity.  2. GERD for which she takes Nexium 40 mg daily.  3. She has previously injured her ankle which has effected her mobility.   SOCIAL HISTORY:  She denies tobacco use and denies alcohol use.  She is  single and is an unemployed person.  She lives with her sister.   FAMILY HISTORY:  Her parents are both alive.   On admission, she was found to have some edema of her peripancreatic tail  and along with that had an elevated lipase and amylase with a lipase of 1498  and an amylase of 1341 with elevated transaminases, normal alkaline  phosphatase, and a total bilirubin of 1.4.   In addition, she had a normal cholesterol and a normal triglyceride level.   She subsequently has undergone a gallbladder ultrasound which showed no  evidence of gallbladder Andreoli thickening.  She has also had a  small free-  layering left pleural effusion noted on her x-rays.  MRCP was negative for  intraductal stones.  She does have 1-3 mm gallstones layering dependently in  the gallbladder neck.  There was retroperitoneal edema, particularly on the  left, consistent with resolving pancreatitis.  No evidence of pseudocyst.  Small pleural effusions and a small amount of ascites.   IMPRESSION:  Gallstone pancreatitis, resolving with normalization of enzymes  so far.  However, the patient currently is having some abdominal pain and  nausea with liquids.   Would consider repeat CT scan in a few days.  Since MR yesterday showed that  she still has edema, I would follow her clinically for the next few days and  then consider repeat CT scan.  Once  the edema has diminished, hopefully  elective laparoscopic cholecystectomy can be pursued.                                               Thornton Park Daphine Deutscher, M.D.    MBM/MEDQ  D:  10/03/2003  T:  10/03/2003  Job:  [id number]

## 2011-01-09 NOTE — H&P (Signed)
   NAME:  Sarah French, GOOSBY                         ACCOUNT NO.:  [id number]   MEDICAL RECORD NO.:  [id number]                   PATIENT TYPE:  EMS   LOCATION:  MAJO                                 FACILITY:  [facility]   PHYSICIAN:  Burnard Bunting, M.D.                 DATE OF BIRTH:  August 29, 1980   DATE OF ADMISSION:  09/22/2002  DATE OF DISCHARGE:                                HISTORY & PHYSICAL   CHIEF COMPLAINT:  Left ankle pain.   HISTORY OF PRESENT ILLNESS:  The patient is a 30 year old female who tripped  on the ice today at 10:45 p.m.  She complains of left ankle pain.  She  denies any other orthopedic complaints.   PAST MEDICAL HISTORY:  Negative.   PAST SURGICAL HISTORY:  Negative.   MEDICATIONS:  None.   ALLERGIES:  None.   EXAM:  She is alert and oriented times three.  Chest is clear to  auscultation.  Heart beats regular rhythm.  Abdominal exam is benign.  There  is no left hip or knee pain with range of motion.  The left lower extremity  has medial skin tinting.  Dorsalis pedis pulse 2+/4.  Sensation is intact on  the dorsal and plantar aspect of the foot.  X-ray is poor quality.  It shows  two laterals which do show a fibular fracture and tibiotalar dislocation.  Labs are pending.   IMPRESSION:  Tibiotalar dislocation with fibular fracture and probable  medial malleolar fracture.   PLAN:  I considered reduction in the ER because of the medial skin tinting.  This was unsuccessful.  Therefore, we will take her to surgery for open  reduction and internal fixation.  Risks and benefits were discussed.  Primary risks included infection, nonunion, malunion, DVT, need for more  surgery, nerve and vessel damage.  These risks are not all-inclusive.  The  patient understands the risks and benefits and will proceed.                                               Burnard Bunting, M.D.    GSD/MEDQ  D:  09/23/2002  T:  09/23/2002  Job:  [id number]

## 2011-05-20 LAB — POCT RAPID STREP A: Streptococcus, Group A Screen (Direct): POSITIVE — AB

## 2011-05-25 LAB — POCT CARDIAC MARKERS: Myoglobin, poc: 27.7

## 2012-08-24 DIAGNOSIS — G589 Mononeuropathy, unspecified: Secondary | ICD-10-CM

## 2012-08-24 HISTORY — DX: Mononeuropathy, unspecified: G58.9

## 2015-02-20 ENCOUNTER — Encounter (HOSPITAL_BASED_OUTPATIENT_CLINIC_OR_DEPARTMENT_OTHER): Payer: Self-pay | Admitting: *Deleted

## 2015-02-20 ENCOUNTER — Emergency Department (HOSPITAL_BASED_OUTPATIENT_CLINIC_OR_DEPARTMENT_OTHER)
Admission: EM | Admit: 2015-02-20 | Discharge: 2015-02-20 | Disposition: A | Discharge status: Home / Self Care | Payer: Medicaid Other | Attending: Emergency Medicine | Admitting: Emergency Medicine

## 2015-02-20 DIAGNOSIS — L600 Ingrowing nail: Secondary | ICD-10-CM | POA: Insufficient documentation

## 2015-02-20 DIAGNOSIS — M79675 Pain in left toe(s): Secondary | ICD-10-CM | POA: Diagnosis present

## 2015-02-20 MED ORDER — DOXYCYCLINE HYCLATE 100 MG PO CAPS: 100.0000 mg | ORAL_CAPSULE | Freq: Two times a day (BID) | ORAL | Status: DC

## 2015-02-20 NOTE — ED Notes (Signed)
Presents with pain in Left great toe for approx 3 weeks

## 2015-02-20 NOTE — Discharge Instructions (Signed)
Ingrown Toenail An ingrown toenail occurs when the sharp edge of your toenail grows into the skin. Causes of ingrown toenails include toenails clipped too far back or poorly fitting shoes. Activities involving sudden stops (basketball, tennis) causing "toe jamming" may lead to an ingrown nail. HOME CARE INSTRUCTIONS   Soak the whole foot in warm soapy water for 20 minutes, 3 times per day.  You may lift the edge of the nail away from the sore skin by wedging a small piece of cotton under the corner of the nail. Be careful not to dig (traumatize) and cause more injury to the area.  Wear shoes that fit well. While the ingrown nail is causing problems, sandals may be beneficial.  Trim your toenails regularly and carefully. Cut your toenails straight across, not in a curve. This will prevent injury to the skin at the corners of the toenail.  Keep your feet clean and dry.  Crutches may be helpful early in treatment if walking is painful.  Antibiotics, if prescribed, should be taken as directed.  Return for a wound check in 2 days or as directed.  Only take over-the-counter or prescription medicines for pain, discomfort, or fever as directed by your caregiver. SEEK IMMEDIATE MEDICAL CARE IF:   You have a fever.  You have increasing pain, redness, swelling, or heat at the wound site.  Your toe is not better in 7 days. If conservative treatment is not successful, surgical removal of a portion or all of the nail may be necessary. MAKE SURE YOU:   Understand these instructions.  Will watch your condition.  Will get help right away if you are not doing well or get worse. Document Released: 08/07/2000 Document Revised: 11/02/2011 Document Reviewed: 08/01/2008 ExitCare Patient Information 2015 [employer]. This information is not intended to replace advice given to you by your health care provider. Make sure you discuss any questions you have with your health care provider.  

## 2015-02-20 NOTE — ED Provider Notes (Signed)
CSN: [id number]     Arrival date & time 02/20/15  1020 History  This chart was scribed for Glynn Octave, MD by Leone Payor, ED Scribe. This patient was seen in room MH03/MH03 and the patient's care was started 10:59 AM.    Chief Complaint  Patient presents with  . Toe Pain    The history is provided by the patient. No language interpreter was used.     HPI Comments: Sarah French is a 34 y.o. female who presents to the Emergency Department complaining of constant, gradually worsened left great toe pain with associated minimal swelling for the past couple of weeks. She denies recent injury or trauma to the affected area. She denies bleeding or drainage. She reports a history of ingrown toenails. She denies fever, vomiting.   History reviewed. No pertinent past medical history. Past Surgical History  Procedure Laterality Date  . Fracture surgery     History reviewed. No pertinent family history. History  Substance Use Topics  . Smoking status: Never Smoker   . Smokeless tobacco: Not on file  . Alcohol Use: No   OB History    No data available     Review of Systems  A complete 10 system review of systems was obtained and all systems are negative except as noted in the HPI and PMH.    Allergies  Zithromax  Home Medications   Prior to Admission medications   Medication Sig Start Date End Date Taking? Authorizing Provider  doxycycline (VIBRAMYCIN) 100 MG capsule Take 1 capsule (100 mg total) by mouth 2 (two) times daily. 02/20/15   Glynn Octave, MD   BP 151/88 mmHg  Pulse 70  Temp(Src) 98.5 F (36.9 C) (Oral)  Resp 18  Ht  (1.626 m)  Wt 294 lb (133.358 kg)  BMI 50.44 kg/m2  SpO2 100%  LMP 02/06/2015 (Approximate) Physical Exam  Constitutional: She is oriented to person, place, and time. She appears well-developed and well-nourished. No distress.  HENT:  Head: Normocephalic and atraumatic.  Mouth/Throat: Oropharynx is clear and moist. No oropharyngeal  exudate.  Eyes: Conjunctivae and EOM are normal. Pupils are equal, round, and reactive to light.  Neck: Normal range of motion. Neck supple.  No meningismus.  Cardiovascular: Normal rate, regular rhythm, normal heart sounds and intact distal pulses.   No murmur heard. Pulmonary/Chest: Effort normal and breath sounds normal. No respiratory distress.  Abdominal: Soft. There is no tenderness. There is no rebound and no guarding.  Musculoskeletal: Normal range of motion. She exhibits no edema or tenderness.  Neurological: She is alert and oriented to person, place, and time. No cranial nerve deficit. She exhibits normal muscle tone. Coordination normal.  No ataxia on finger to nose bilaterally. No pronator drift. 5/5 strength throughout. CN 2-12 intact. Negative Romberg. Equal grip strength. Sensation intact. Gait is normal.   Skin: Skin is warm. No erythema.  Tenderness to palpation of the lateral nail fold of the left great toe. No fluctuance or erythema. No appreciable paronychia.   Psychiatric: She has a normal mood and affect. Her behavior is normal.  Nursing note and vitals reviewed.   ED Course  Procedures (including critical care time)  DIAGNOSTIC STUDIES: Oxygen Saturation is 100% on RA, normal by my interpretation.    COORDINATION OF CARE: 11:02 AM Discussed treatment plan with pt at bedside and pt agreed to plan.  Labs Review Labs Reviewed - No data to display  Imaging Review No results found.   EKG Interpretation  None      MDM   Final diagnoses:  Ingrown left big toenail   3 weeks of atraumatic left toe pain worse with ambulation. Denies fever or vomiting. She is not diabetic.  Exam shows probable ingrown toenail of the left great toe. There is no appreciable abscess or paronychia.  She'll be given empiric antibiotics, referral to podiatry, hard sole shoe. Instructed on warm soaks.  Return precautions given.    I personally performed the services described in  this documentation, which was scribed in my presence. The recorded information has been reviewed and is accurate.   Glynn OctaveStephen Shaneeka Scarboro, MD 02/20/15 1122

## 2015-02-20 NOTE — ED Notes (Signed)
Pain is worse when ambulating, when resting and foot elevated no pain present, has taken ibuprofen to assist with pain control

## 2015-02-22 ENCOUNTER — Encounter: Payer: Self-pay | Admitting: Podiatry

## 2015-02-22 ENCOUNTER — Ambulatory Visit (INDEPENDENT_AMBULATORY_CARE_PROVIDER_SITE_OTHER): Payer: Medicaid Other | Admitting: Podiatry

## 2015-02-22 VITALS — BP 143/83 | HR 78 | Ht 64.0 in | Wt 294.0 lb

## 2015-02-22 DIAGNOSIS — M79673 Pain in unspecified foot: Secondary | ICD-10-CM

## 2015-02-22 DIAGNOSIS — L600 Ingrowing nail: Secondary | ICD-10-CM | POA: Diagnosis not present

## 2015-02-22 DIAGNOSIS — M79606 Pain in leg, unspecified: Secondary | ICD-10-CM

## 2015-02-22 DIAGNOSIS — M79605 Pain in left leg: Secondary | ICD-10-CM

## 2015-02-22 NOTE — Patient Instructions (Signed)
Ingrown nail surgery was done on left great toe lateral border. Follow soaking instruction.  Some redness and drainage is expected. Call the office if the area gets feverish with increased redness and drainage.  

## 2015-02-22 NOTE — Progress Notes (Signed)
Subjective: 34 year old female presents complaining of having ingrown nail on both great toes in regular base.  Objective: Dermatologic: Painful ingrown nail both great toe L>R. No drainage noted.  Pedal pulses are palpable bilateral. No edema or erythema.  No gross deformities in osseous structures.  Assessment: Painful ingrown nails both great toes.  Plan:  Ingrown nail surgery was done on left great toe lateral border. Affected left great toe was anesthetized with total 5ml mixture of 50/50 0.5% Marcaine plain and 1% Xylocaine plain. Affected lateral nail border was reflected with a nail elevator and excised with nail nipper. Proximal nail matrix tissue was cauterized with Phenol soaked cotton applicator x 4 and neutralized with Alcohol soaked cotton applicator. The wound was dressed with Amerigel ointment dressing. Home care instructions and supply dispensed.  Return in 1 week for follow up.

## 2015-02-27 ENCOUNTER — Encounter: Payer: Self-pay | Admitting: Podiatry

## 2015-02-27 ENCOUNTER — Ambulatory Visit (INDEPENDENT_AMBULATORY_CARE_PROVIDER_SITE_OTHER): Payer: Medicaid Other | Admitting: Podiatry

## 2015-02-27 DIAGNOSIS — Z9889 Other specified postprocedural states: Secondary | ICD-10-CM

## 2015-02-27 NOTE — Patient Instructions (Signed)
Post nail surgery left great toe doing well. Continue to soak till pain stops. May need ingrown nail surgery on right side next month.

## 2015-02-27 NOTE — Progress Notes (Signed)
Doing well following nail surgery on left great toe. Some residual redness. Continue to soak another week. May need ingrown nail surgery on right great toe. Discussed custom orthotics on hyper pronating feet.

## 2015-03-01 ENCOUNTER — Encounter: Payer: Medicaid Other | Admitting: Podiatry

## 2015-05-14 ENCOUNTER — Encounter: Payer: Self-pay | Admitting: Diagnostic Neuroimaging

## 2015-05-14 ENCOUNTER — Ambulatory Visit (INDEPENDENT_AMBULATORY_CARE_PROVIDER_SITE_OTHER): Payer: Medicaid Other | Admitting: Diagnostic Neuroimaging

## 2015-05-14 VITALS — BP 125/81 | HR 74 | Ht 63.5 in | Wt 305.4 lb

## 2015-05-14 DIAGNOSIS — R0683 Snoring: Secondary | ICD-10-CM

## 2015-05-14 DIAGNOSIS — R5382 Chronic fatigue, unspecified: Secondary | ICD-10-CM | POA: Diagnosis not present

## 2015-05-14 DIAGNOSIS — G5712 Meralgia paresthetica, left lower limb: Secondary | ICD-10-CM

## 2015-05-14 DIAGNOSIS — G43009 Migraine without aura, not intractable, without status migrainosus: Secondary | ICD-10-CM

## 2015-05-14 MED ORDER — TOPIRAMATE 50 MG PO TABS: 50.0000 mg | ORAL_TABLET | Freq: Two times a day (BID) | ORAL | Status: DC

## 2015-05-14 NOTE — Progress Notes (Signed)
[facility]  PATIENT: Sarah French DOB: 07-14-1981  REFERRING CLINICIAN: Lysle Dingwall HISTORY FROM: patient  REASON FOR VISIT: new consult    HISTORICAL  CHIEF COMPLAINT:  Chief Complaint  Patient presents with  . Headache    rm 6,  New Patient    HISTORY OF PRESENT ILLNESS:   34 year old right-handed female here for evaluation of headaches and left leg numbness.  Since middle school patient has had intermittent headaches, frontal, temporal, severe headaches with photophobia, phonophobia, nausea. Sometimes patient would have to leave school early, late on the dark quiet room. I high school these headaches have resolved.  Around 2008 patient started to develop new type of pressure, frontal, headaches sometimes sharp shooting pain, sometimes throbbing, which gradually increased over time. Now patient having daily headaches. 2 times per week she has severe headaches with nausea, leading patient to go lay down. 2014 she was about a by neurologist who diagnosed her with migraine headaches. Patient started on topiramate, topiramate extended release, gabapentin, sumatriptan without relief.  Patient is also developed numbness and tingling and burning sensation in the left lateral thigh over last few years. Patient was diagnosed with meralgia paresthetica and advised to proceed with weight loss strategies and gabapentin.  Patient also has snoring and daytime fatigue. She had a sleep study 2 years ago which showed mild sleep apnea, but not qualifying for CPAP therapy. Symptoms have worsened since that time. She would like to have another sleep study now.  Patient currently has PCP who is her OB/GYN. She wants help with weight loss, it is on the wait list at the bariatric surgery clinic in [place].    REVIEW OF SYSTEMS: Full 14 system review of systems performed and notable only for allergies weight gain fatigue snoring joint pain joint swelling aching muscles decreased  energy dizziness headache numbness sleepiness snoring.  ALLERGIES: Allergies  Allergen Reactions  . Zithromax [Azithromycin] Itching    HOME MEDICATIONS: Outpatient Prescriptions Prior to Visit  Medication Sig Dispense Refill  . doxycycline (VIBRAMYCIN) 100 MG capsule Take 1 capsule (100 mg total) by mouth 2 (two) times daily. 20 capsule 0   No facility-administered medications prior to visit.    PAST MEDICAL HISTORY: No past medical history on file.  PAST SURGICAL HISTORY: Past Surgical History  Procedure Laterality Date  . Fracture surgery Left 2004    ankle  . Eye surgery      as infant and child    FAMILY HISTORY: Family History  Problem Relation Age of Onset  . Hypertension Mother   . Hypertension Father   . Arthritis Sister     SOCIAL HISTORY:  Social History   Social History  . Marital Status: Single    Spouse Name: N/A  . Number of Children: 1  . Years of Education: N/A   Occupational History  . student     [employer]   Social History Main Topics  . Smoking status: Former Games developer  . Smokeless tobacco: Never Used  . Alcohol Use: No  . Drug Use: No  . Sexual Activity: Not on file   Other Topics Concern  . Not on file   Social History Narrative   Lives at home with parents, son   Caffeine use-      PHYSICAL EXAM  GENERAL EXAM/CONSTITUTIONAL: Vitals:  Filed Vitals:   05/14/15 1054  BP: 125/81  Pulse: 74  Height: 5' 3.5" (1.613 m)  Weight: 305 lb 6.4 oz (138.529 kg)  Body mass index is 53.24 kg/(m^2).  Visual Acuity Screening   Right eye Left eye Both eyes  Without correction:     With correction: 20/30 20/20      Patient is in no distress; well developed, nourished and groomed; neck is supple  CARDIOVASCULAR:  Examination of carotid arteries is normal; no carotid bruits  Regular rate and rhythm, no murmurs  Examination of peripheral vascular system by observation and palpation is normal  EYES:  Ophthalmoscopic exam of  optic discs and posterior segments is normal; no papilledema or hemorrhages  MUSCULOSKELETAL:  Gait, strength, tone, movements noted in Neurologic exam below  NEUROLOGIC: MENTAL STATUS:  No flowsheet data found.  awake, alert, oriented to person, place and time  recent and remote memory intact  normal attention and concentration  language fluent, comprehension intact, naming intact,   fund of knowledge appropriate  CRANIAL NERVE:   2nd - no papilledema on fundoscopic exam  2nd, 3rd, 4th, 6th - pupils equal and reactive to light, visual fields full to confrontation, extraocular muscles intact, no nystagmus  5th - facial sensation symmetric  7th - facial strength symmetric  8th - hearing intact  9th - palate elevates symmetrically, uvula midline  11th - shoulder shrug symmetric  12th - tongue protrusion midline  MOTOR:   normal bulk and tone, full strength in the BUE, BLE  SENSORY:   normal and symmetric to light touch, pinprick, temperature, vibration; HYPERSENSITIVE IN LEFT LATERAL THIGH  COORDINATION:   finger-nose-finger, fine finger movements normal  REFLEXES:   deep tendon reflexes TRACE and symmetric  GAIT/STATION:   narrow based gait; romberg is negative    DIAGNOSTIC DATA (LABS, IMAGING, TESTING) - I reviewed patient records, labs, notes, testing and imaging myself where available.  Lab Results  Component Value Date   HGB 13.9 10/30/2008   HCT 41.0 10/30/2008      Component Value Date/Time   NA 140 10/30/2008 1825   K 3.9 10/30/2008 1825   CL 104 10/30/2008 1825   GLUCOSE 90 10/30/2008 1825   BUN 8 10/30/2008 1825   CREATININE 0.9 10/30/2008 1825   No results found for: CHOL, HDL, LDLCALC, LDLDIRECT, TRIG, CHOLHDL No results found for: ZOXW9U No results found for: VITAMINB12 No results found for: TSH  10/30/08 CXR [I reviewed images myself and agree with interpretation. -VRP]  - Suboptimal inspiration. No acute cardiopulmonary  disease.    ASSESSMENT AND PLAN  34 y.o. year old female here with long history of migraine headaches, now with chronic migraine, tension headaches and chronic daily headaches. Patient also at risk for sleep apnea and associated headaches. No papilledema at this time which makes pseudotumor cerebri less likely, but not totally excluded.  Advised conservative mgmt for meralgia paresthetica. Long term weight loss is very important for patient's symptoms.   Dx:   Migraine without aura and without status migrainosus, not intractable  Severe obesity (BMI >= 40) - Plan: Ambulatory referral to Sleep Studies  Meralgia paresthetica of left side  Snoring  Chronic fatigue - Plan: Ambulatory referral to Sleep Studies    PLAN: - check sleep study to eval for OSA (last study in 2014 showed AHI 2.2) - trial of TPX - may consider LP in future - nutrition, exercise and weight loss strategies reviewed and encouraged  Orders Placed This Encounter  Procedures  . Ambulatory referral to Sleep Studies   Meds ordered this encounter  Medications  . topiramate (TOPAMAX) 50 MG tablet    Sig:  Take 1 tablet (50 mg total) by mouth 2 (two) times daily.    Dispense:  60 tablet    Refill:  12   Return in about 3 months (around 08/13/2015).    Suanne Marker, MD 05/14/2015, 11:45 AM Certified in Neurology, Neurophysiology and Neuroimaging  [facility] [address], [address] [place], [place] [address] [phone]

## 2015-05-14 NOTE — Patient Instructions (Addendum)
Try topiramate  at bedtime. After 2 weeks increase to twice a day. Drink plenty of water with topiramate.  I will setup sleep study referral.  Establish with primary care. Ask for referral to nutritionist and weight loss clinic.

## 2015-06-04 ENCOUNTER — Ambulatory Visit (INDEPENDENT_AMBULATORY_CARE_PROVIDER_SITE_OTHER): Payer: Medicaid Other | Admitting: Neurology

## 2015-06-04 ENCOUNTER — Encounter: Payer: Self-pay | Admitting: Neurology

## 2015-06-04 VITALS — BP 126/68 | HR 80 | Resp 20 | Ht 63.5 in | Wt 304.0 lb

## 2015-06-04 DIAGNOSIS — R0683 Snoring: Secondary | ICD-10-CM

## 2015-06-04 DIAGNOSIS — R51 Headache: Secondary | ICD-10-CM

## 2015-06-04 DIAGNOSIS — R0689 Other abnormalities of breathing: Secondary | ICD-10-CM | POA: Diagnosis not present

## 2015-06-04 DIAGNOSIS — G4730 Sleep apnea, unspecified: Secondary | ICD-10-CM | POA: Diagnosis not present

## 2015-06-04 DIAGNOSIS — R519 Headache, unspecified: Secondary | ICD-10-CM

## 2015-06-04 NOTE — Patient Instructions (Signed)

## 2015-06-04 NOTE — Progress Notes (Signed)
SLEEP MEDICINE CLINIC   Provider:  Melvyn Novas, M D  Referring Provider: Suanne Marker, MD Primary Care Physician:  Okey Dupre, MD GYN   Chief Complaint  Patient presents with  . New Patient (Initial Visit)    sleep study, snoring, fatigued, tired when she wakes up, headaches, rm 11, alone     Chief complaint according to patient : The patient reports not having restorative or refreshing sleep and waking with headaches. Headaches do not wake her.  HPI:  Sarah French is a caucasian, right handed , single mother of a son,  34 y.o. female , seen here as a referral  from Dr. Marjory Lies for a sleep evaluation, consultation.   Sarah French is referred by my partner Dr. Joycelyn Schmid, for a sleep evaluation. He was mainly concerned because the patient described frequent almost daily headaches severe headaches that were there at the time when she woke up in the morning. Her headaches can progress over the days that can also resolve. Since middle school she has had intermittent headaches with a location to the frontal temporal area on both sides of the head. Sometimes she will even have occipital pain. She would leave school sometimes early because of the headaches. She would retired to a dark and quiet room. During high school years these headaches get better and resolved. Her headaches were associated with nausea but she reports not to have had to vomit. She just felt queasy and she had photophobia and phonophobia associated with it. About 2008 her headaches resumed. Sometimes with a sharp shooting pain no sometimes throbbing sometimes generalized. She begun having the headaches daily and about twice a week so severe that she could not function at work.  In 2014, she was seeking the help of a neurologist  ( HP, Chrystal Rose, FNP ) because her leg was hurting ,but the neurologist NP  was much more concerned about her headache syndrome. She wanted  help for tingling and burning sensation in  the left lateral thigh that had evolved over a couple of years and was diagnosed as meralgia paresthetica.  She was given gabapentin to take the edge of the burning , but also was advised to proceed with weight loss.  She has reportedly snoring and has been witnessed to snore and she suffers from daytime fatigue and sleepiness.   About 3 years ago she was seen in a sleep study in [place] at [facility] ,which showed only mild sleep apnea not enough to split the study and not enough to qualify for CPAP therapy.  The symptoms however have worsened since 2013, she continued to gain weight.   Sleep habits are as follows: A usual day would lead her to go to bed at around 10 PM and she would be asleep in bed by about 10:30. The bedroom is described as core, quiet and dark and she likes the background plan. Sleeps alone. Her sleep is interrupted by the urge to urinate about 2-3 times at night. She prefers to sleep on her side, and she is usually prone or on the side when she wakes up. Back pain and leg pain have also woken her from sleep. She rises at 6 AM in the morning, relies on an alarm. She strictly drinks water related beverages nor ice tea coffee or soda. Some mornings she will push the snooze button. She does not restored or refreshed from her sleep at night given that she probably gets close to about 7 hours  of sleep. She is currently attending school, she does not have a shift work history. He commutes for about 20 minutes to her place of education. Her classroom  has no natural daylight. She does not nap in daytime.   Sleep medical history and family sleep history:    Social history: She is a nonsmoker, nondrinker and non-caffeine user. She is currently not gainfully employed but in school. She is a single mother of one son.  Review of Systems: Out of a complete 14 system review, the patient complains of only the following symptoms, and all other reviewed systems are  negative. She endorsed fatigue, snoring, joint swelling, some lower back pain and feeling of decreased energy nonrestorative sleep and daytime sleepiness and headaches numbness. She endorsed the Epworth sleepiness score at 8 points and the fatigue severity score at 36 points.  Depression score n/a    Social History   Social History  . Marital Status: Single    Spouse Name: N/A  . Number of Children: 1  . Years of Education: N/A   Occupational History  . student     [employer]   Social History Main Topics  . Smoking status: Former Games developer  . Smokeless tobacco: Never Used  . Alcohol Use: No  . Drug Use: No  . Sexual Activity: Not on file   Other Topics Concern  . Not on file   Social History Narrative   Lives at home with parents, son   Caffeine use-    Going to school for medical assisting.    Family History  Problem Relation Age of Onset  . Hypertension Mother   . Hypertension Father   . Arthritis Sister   . Migraines Maternal Grandmother     Past Medical History  Diagnosis Date  . Migraines   . Dry eyes   . Obesity   . Morbidly obese (HCC)   . Hypoventilation associated with obesity syndrome [facility])     Past Surgical History  Procedure Laterality Date  . Fracture surgery Left 2004    ankle  . Eye surgery      as infant and child    Current Outpatient Prescriptions  Medication Sig Dispense Refill  . fexofenadine (ALLEGRA) 30 MG tablet Take 30 mg by mouth 2 (two) times daily.    . Multiple Vitamin (MULTIVITAMIN) tablet Take 1 tablet by mouth daily.    Marland Kitchen PAZEO 0.7 % SOLN INSTILL 1 DROP IN THE MORNING INTO BOTH EYES  3  . RESTASIS 0.05 % ophthalmic emulsion INSTILL 1 DROP INTO BOTH EYES TWICE A DAY  3  . topiramate (TOPAMAX) 50 MG tablet Take 1 tablet (50 mg total) by mouth 2 (two) times daily. 60 tablet 12   No current facility-administered medications for this visit.    Allergies as of 06/04/2015 - Review Complete 06/04/2015  Allergen Reaction Noted  .  Zithromax [azithromycin] Itching 02/20/2015    Vitals: BP 126/68 mmHg  Pulse 80  Resp 20  Ht 5' 3.5" (1.613 m)  Wt 304 lb (137.893 kg)  BMI 53.00 kg/m2 Last Weight:  Wt Readings from Last 1 Encounters:  06/04/15 304 lb (137.893 kg)   ZOX:WRUE mass index is 53 kg/(m^2).     Last Height:   Ht Readings from Last 1 Encounters:  06/04/15 5' 3.5" (1.613 m)    Physical exam:  General: The patient is awake, alert and appears not in acute distress. The patient is well groomed. Head: Normocephalic, atraumatic. Neck is supple. Mallampati 3  neck circumference:17. Nasal airflow unrestricted , TMJ click not evident . Retrognathia is not seen.  Cardiovascular:  Regular rate and rhythm, without  murmurs or carotid bruit, and without distended neck veins. Respiratory: Lungs are clear to auscultation. Skin:  Without evidence of edema, or rash Trunk: BMI is elevated - The patient's posture is erect . Neurologic exam : The patient is awake and alert, oriented to place and time.   Memory subjective described as intact.  Attention span & concentration ability appears normal.  Speech is fluent,  without  dysarthria, dysphonia or aphasia.  Mood and affect are appropriate.  Cranial nerves: Pupils are equal and briskly reactive to light. Funduscopic exam without  Visualization of the papilla.  Extraocular movements  in vertical and horizontal planes intact and with end point  nystagmus. Visual fields by finger perimetry are intact. Hearing to finger rub intact. Facial sensation intact to fine touch.Facial motor strength is symmetric and tongue and uvula move midline. Shoulder shrug was symmetrical.   Motor exam:  Normal tone, muscle bulk and symmetric strength in all extremities.  Sensory:  Fine touch, pinprick and vibration were tested in all extremities. Proprioception tested in the upper extremities was normal.  Coordination: Rapid alternating movements in the fingers/hands was normal.  Finger-to-nose maneuver  normal without evidence of ataxia, dysmetria or tremor.  Gait and station: Patient walks without assistive device and is able unassisted to climb up to the exam table. Strength within normal limits.  Stance is stable and normal.  Toe and hell stand were tested .Tandem gait is unfragmented. Turns with 3 Steps. Romberg testing is negative.  Deep tendon reflexes: in the upper and lower extremities are symmetric and intact. Babinski maneuver response is  downgoing.  The patient was advised of the nature of the diagnosed sleep disorder , the treatment options and risks for general a health and wellness arising from not treating the condition.  I spent more than 55 minutes of face to face time with the patient. Greater than 50% of time was spent in counseling and coordination of care. We have discussed the diagnosis and differential and I answered the patient's questions.    Assessment:  After physical and neurologic examination, review of laboratory studies,  Personal review of imaging studies, reports of other /same  Imaging studies ,  Results of polysomnography/ neurophysiology testing and pre-existing records as far as provided in visit., my assessment is   1) Sarah French has a very  high risk of having obstructive sleep apnea and the  witnessed snoring also leads to this conclusion. She wakes gasping for air.  It is her elevated body mass index and her neck size that  further predispose her. I would like for her to undergo an attended sleep study with capnography since her body mass index exceeds 40 and she is at risk of obesity hypoventilation.  She has woken up with palpitations which can be a response to obstructive or central sleep apnea and is a compensation mechanism for hypoxemia.  2) obesity hypoventilation leads to retention of CO2 and treated in a sufficient intake of O2, leading to a dilation of blood vessels causing headaches often in the morning hours but then  become better as the day goes on. In migraineurs it can also precipitate chronic daily migraines. The patient was 14 days ago seen by Dr. Marjory Lies  and given topiramate. I hope this will reduce her migraine frequency but it may not have any effect on her morning headaches.  3) she is borderline depressed and has trouble with attention in school, has a son with ADHD on medication.  Should the patient not be diagnosed with sleep-disordered breathing after her attended sleep study with capnography I would certainly want her to be evaluated for adult attention deficit disorder. The treatment with stimulants will also help her energy level. It may help her to loose weight.   4) the patient has in the past had some success with her weight watchers diet but she is your-year-old back to her previous weight or beyond. She is interested in bariatric surgery and is on a waiting list at [facility].    Plan:  Treatment plan and additional workup :   Split study with Co2, RV after sleep study.   Porfirio Mylar Mella Inclan MD  06/04/2015   CC: Suanne Marker, Md [address] [place], [place] [address]

## 2015-06-18 ENCOUNTER — Encounter: Payer: Self-pay | Admitting: Diagnostic Neuroimaging

## 2015-06-23 ENCOUNTER — Ambulatory Visit (INDEPENDENT_AMBULATORY_CARE_PROVIDER_SITE_OTHER): Payer: Medicaid Other | Admitting: Neurology

## 2015-06-23 DIAGNOSIS — R519 Headache, unspecified: Secondary | ICD-10-CM

## 2015-06-23 DIAGNOSIS — R0683 Snoring: Secondary | ICD-10-CM

## 2015-06-23 DIAGNOSIS — G4730 Sleep apnea, unspecified: Secondary | ICD-10-CM

## 2015-06-23 DIAGNOSIS — R0689 Other abnormalities of breathing: Secondary | ICD-10-CM

## 2015-06-23 DIAGNOSIS — R51 Headache: Secondary | ICD-10-CM

## 2015-06-24 NOTE — Sleep Study (Signed)
Please see the scanned sleep study interpretation located in the Procedure tab within the Chart Review section. 

## 2015-07-01 ENCOUNTER — Telehealth: Payer: Self-pay

## 2015-07-01 NOTE — Telephone Encounter (Signed)
Spoke to pt regarding her sleep study results. I advised her that her study revealed signs of UARS and snoring but no apnea was seen. I advised her that Dr. Vickey Hugerohmeier recommends treatment for the snoring, and an oral appliance for ENT evaluation is a possibility. I also advised her that PLMS were seen in her sleep with associated sleep disruption. Pt wishes to come in for a f/u appt to discuss these results further. Appt made with Dr. Vickey Hugerohmeier on 07/02/2015 at 1:30. Pt verbalized understanding.

## 2015-07-01 NOTE — Telephone Encounter (Signed)
Pt called returning Kristen's call. ° °

## 2015-07-01 NOTE — Telephone Encounter (Signed)
Called pt to discuss sleep study results. No answer, left a message asking her to call me back. 

## 2015-07-02 ENCOUNTER — Ambulatory Visit (INDEPENDENT_AMBULATORY_CARE_PROVIDER_SITE_OTHER): Payer: Medicaid Other | Admitting: Neurology

## 2015-07-02 ENCOUNTER — Encounter: Payer: Self-pay | Admitting: Neurology

## 2015-07-02 VITALS — BP 124/76 | HR 82 | Resp 20 | Ht 63.5 in | Wt 304.0 lb

## 2015-07-02 DIAGNOSIS — J302 Other seasonal allergic rhinitis: Secondary | ICD-10-CM

## 2015-07-02 DIAGNOSIS — R0683 Snoring: Secondary | ICD-10-CM | POA: Diagnosis not present

## 2015-07-02 NOTE — Progress Notes (Signed)
SLEEP MEDICINE CLINIC   Provider:  Melvyn French, M D  Referring Provider: No ref. provider found Primary Care Physician:  Sarah Dupre, MD GYN   Chief Complaint  Patient presents with  . Follow-up    sleep study results, rm 11, alone     Chief complaint according to patient : The patient reports not having restorative or refreshing sleep and waking with headaches. Headaches do not wake her.  HPI:  Sarah French is a caucasian, right handed , single mother of a son,  34 y.o. female , seen here as a referral  from Dr. Marjory French for a sleep evaluation / consultation.   Sarah French is referred by my partner Dr. Joycelyn French, for a sleep evaluation. He was mainly concerned because the patient described frequent almost daily headaches severe headaches that were there at the time when she woke up in the morning. Her headaches can progress over the days that can also resolve. Since middle school she has had intermittent headaches with a location to the frontal temporal area on both sides of the head. Sometimes she will even have occipital pain. She would leave school sometimes early because of the headaches. She would retired to a dark and quiet room. During high school years these headaches get better and resolved. Her headaches were associated with nausea but she reports not to have had to vomit. She just felt queasy and she had photophobia and phonophobia associated with it. About 2008 her headaches resumed. Sometimes with a sharp shooting pain no sometimes throbbing sometimes generalized. She begun having the headaches daily and about twice a week so severe that she could not function at work.  In 2014, she was seeking the help of a neurologist  ( HP, Sarah Rose, FNP ) because her leg was hurting ,but the neurologist NP  was much more concerned about her headache syndrome. She wanted  help for tingling and burning sensation in the left lateral thigh that had evolved over a couple of years  and was diagnosed as meralgia paresthetica.  She was given gabapentin to take the edge of the burning , but also was advised to proceed with weight loss.  She has reportedly snoring and has been witnessed to snore and she suffers from daytime fatigue and sleepiness.   About 3 years ago she was seen in a sleep study in [place] at [facility] ,which showed only mild sleep apnea not enough to split the study and not enough to qualify for CPAP therapy.  The symptoms however have worsened since 2013, she continued to gain weight.  Sleep habits are as follows: A usual day would lead her to go to bed at around 10 PM and she would be asleep in bed by about 10:30. The bedroom is described as core, quiet and dark and she likes the background plan. Sleeps alone. Her sleep is interrupted by the urge to urinate about 2-3 times at night. She prefers to sleep on her side, and she is usually prone or on the side when she wakes up. Back pain and leg pain have also woken her from sleep. She rises at 6 AM in the morning, relies on an alarm. She strictly drinks water related beverages nor ice tea coffee or soda. Some mornings she will push the snooze button. She does not restored or refreshed from her sleep at night given that she probably gets close to about 7 hours of sleep. She is currently attending school, she does not  have a shift work history. He commutes for about 20 minutes to her place of education. Her classroom  has no natural daylight. She does not nap in daytime.  Social history: She is a nonsmoker, nondrinker and non-caffeine user. She is currently not gainfully employed but in school. She is a single mother of one son.   Interval history from 07-02-15 Mrs. Sarah French here today to follow-up on her recent polysomnography study from 06-23-15 the AHI was 1.5 during REM sleep slightly exacerbated to 5.1 there was truly no significant clinical interruption of sleep noted be also measured carbon  dioxide which increased close to 50 torr but never crossed the 50 torr barrier. She had very rare periodic limb movements and no related arousals. The total arousal index was 15.8 and the majority of these were spontaneous.  Frank apnea was not seen the patient slept for about 85% of the night but felt that she had not slept at all. The fragmentation of sleep was evident and she wakes up spontaneously for seconds at a time but I could not identify a physiological reason. One possible consideration could be an underlying anxiety disorder. She is frequently worried or has ruminating thoughts. I recommend for her to seek anxiety treatment but I also recommend  to eliminate caffeine containing beverages and chocolate (at least for the afternoon hours). The patient is still expecting weight loss surgery perhaps later this year the next year. I will give her today a small weight loss guide and also a book tip about the 2 and 5 diet. In addition she could be treated for snoring alone with a dental device. However I think she is doing better just sleeping on her side or prone which eliminates most snoring.  Review of Systems: Out of a complete 14 system review, the patient complains of only the following symptoms, and all other reviewed systems are negative. She endorsed fatigue, snoring, joint swelling, some lower back pain and feeling of decreased energy nonrestorative sleep and daytime sleepiness and headaches numbness. She endorsed the Epworth sleepiness score at 8 points and the fatigue severity score at 36 points.  Depression score n/a .    Social History   Social History  . Marital Status: Single    Spouse Name: N/A  . Number of Children: 1  . Years of Education: N/A   Occupational History  . student     [employer]   Social History Main Topics  . Smoking status: Former Games developermoker  . Smokeless tobacco: Never Used  . Alcohol Use: No  . Drug Use: No  . Sexual Activity: Not on file   Other Topics  Concern  . Not on file   Social History Narrative   Lives at home with parents, son   Caffeine use-    Going to school for medical assisting.    Family History  Problem Relation Age of Onset  . Hypertension Mother   . Hypertension Father   . Arthritis Sister   . Migraines Maternal Grandmother     Past Medical History  Diagnosis Date  . Migraines   . Dry eyes   . Obesity   . Morbidly obese (HCC)   . Hypoventilation associated with obesity syndrome [facility](HCC)     Past Surgical History  Procedure Laterality Date  . Fracture surgery Left 2004    ankle  . Eye surgery      as infant and child    Current Outpatient Prescriptions  Medication Sig Dispense Refill  .  fexofenadine (ALLEGRA) 30 MG tablet Take 30 mg by mouth 2 (two) times daily.    . Multiple Vitamin (MULTIVITAMIN) tablet Take 1 tablet by mouth daily.    Marland Kitchen PAZEO 0.7 % SOLN INSTILL 1 DROP IN THE MORNING INTO BOTH EYES  3  . RESTASIS 0.05 % ophthalmic emulsion INSTILL 1 DROP INTO BOTH EYES TWICE A DAY  3  . topiramate (TOPAMAX) 50 MG tablet Take 1 tablet (50 mg total) by mouth 2 (two) times daily. 60 tablet 12   No current facility-administered medications for this visit.    Allergies as of 07/02/2015 - Review Complete 07/02/2015  Allergen Reaction Noted  . Zithromax [azithromycin] Itching 02/20/2015    Vitals: BP 124/76 mmHg  Pulse 82  Resp 20  Ht 5' 3.5" (1.613 m)  Wt 304 lb (137.893 kg)  BMI 53.00 kg/m2 Last Weight:  Wt Readings from Last 1 Encounters:  07/02/15 304 lb (137.893 kg)   ZOX:WRUE mass index is 53 kg/(m^2).     Last Height:   Ht Readings from Last 1 Encounters:  07/02/15 5' 3.5" (1.613 m)    Physical exam:  General: The patient is awake, alert and appears not in acute distress. The patient is well groomed. Head: Normocephalic, atraumatic. Neck is supple. Mallampati 3   neck circumference:17. Nasal airflow unrestricted , TMJ click not evident . Retrognathia is not seen.  Cardiovascular:   Regular rate and rhythm, without  murmurs or carotid bruit, and without distended neck veins. Respiratory: Lungs are clear to auscultation. Skin:  Without evidence of edema, or rash Trunk: BMI is elevated - The patient's posture is erect . Neurologic exam : The patient is awake and alert, oriented to place and time.   Memory subjective described as intact.  Attention span & concentration ability appears normal.  Speech is fluent,  without  dysarthria, dysphonia or aphasia.  Mood and affect are appropriate.  Cranial nerves: Pupils are equal and briskly reactive to light.  Extraocular movements  in vertical and horizontal planes intact and with end point  nystagmus. Visual fields by finger perimetry are intact. Hearing to finger rub intact. Facial sensation intact to fine touch.Facial motor strength is symmetric and tongue and uvula move midline. Shoulder shrug was symmetrical.   Motor exam:  Normal tone, muscle bulk and symmetric strength in all extremities.  Sensory:  Deferred   Coordination: Rapid alternating movements in the fingers/hands was normal. Finger-to-nose maneuver  normal without evidence of ataxia, dysmetria or tremor.  Gait and station: deferred  Deep tendon reflexes: in the upper and lower extremities are symmetric and intact. Babinski maneuver response is  downgoing.  The patient was advised of the nature of the diagnosed sleep disorder , the treatment options and risks for general a health and wellness arising from not treating the condition.  I spent more than 15 minutes of face to face time with the patient. Greater than 50% of time was spent in counseling and coordination of care. We have discussed the diagnosis and differential and I answered the patient's questions.    Assessment:  After physical and neurologic examination, review of laboratory studies,  Personal review of imaging studies, reports of other /same  Imaging studies ,  Results of polysomnography/  neurophysiology testing and pre-existing records as far as provided in visit., my assessment is   1) No frank apnea was found, the patiet has very light sleep, but did sleep for 85% of the night. She has a different sleep perception.  2) the patient has in the past had some success with her weight watchers diet but she is your-year-old back to her previous weight or beyond. She is interested in bariatric surgery and is on a waiting list at [facility].  3) I found no evidence of an chronic sleep disorder that would overlap with her headache history. There was borderline CO2 retention seen but no significant hypoxemia.    Return to primary neurologist.  Plan:  Treatment plan and additional workup : Referral to Nutrionist, weight loss program. Dental device for snoring.  Discussed, but she can equally benefit from positional therapy.     Porfirio Mylar Inda Mcglothen MD  07/02/2015

## 2015-08-27 ENCOUNTER — Ambulatory Visit: Payer: Medicaid Other | Admitting: Diagnostic Neuroimaging

## 2016-01-21 ENCOUNTER — Other Ambulatory Visit: Payer: Self-pay | Admitting: Surgical Oncology

## 2016-01-21 DIAGNOSIS — K219 Gastro-esophageal reflux disease without esophagitis: Secondary | ICD-10-CM

## 2016-01-23 ENCOUNTER — Other Ambulatory Visit: Payer: Self-pay | Admitting: Surgical Oncology

## 2016-01-23 ENCOUNTER — Ambulatory Visit
Admission: RE | Admit: 2016-01-23 | Discharge: 2016-01-23 | Disposition: A | Discharge status: Home / Self Care | Payer: Medicaid Other | Source: Ambulatory Visit | Attending: Surgical Oncology | Admitting: Surgical Oncology

## 2016-01-23 DIAGNOSIS — K219 Gastro-esophageal reflux disease without esophagitis: Secondary | ICD-10-CM

## 2016-01-28 ENCOUNTER — Other Ambulatory Visit: Payer: Medicaid Other

## 2016-07-13 ENCOUNTER — Encounter (HOSPITAL_COMMUNITY): Payer: Self-pay

## 2016-07-13 ENCOUNTER — Ambulatory Visit: Payer: Self-pay | Admitting: Ophthalmology

## 2016-07-13 NOTE — H&P (Signed)
Date of examination:  06-03-16  Indication for surgery: to straighten the eyes and allow some binocularity  Pertinent past medical history:  Past Medical History:  Diagnosis Date  . Dry eyes   . Hypoventilation associated with obesity syndrome (HCC)   . Migraines   . Morbidly obese (HCC)   . 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 Hypertension Mother   . Hypertension Father   . Arthritis Sister   . Migraines Maternal Grandmother     General:  Healthy appearing patient in no distress.    Eyes:    Acuity  cc OD 20/20  OS 20/20  External: Within normal limits     Anterior segment: Within normal limits  X healed conj scars OU  Motility:   ET=10, ET'=17.  IO OA OU, RHT in L gaze, LHT in R gaze  Fundus: Normal     Refraction:   -5 SE OU approx    Systemic:  No respiratory difficulty.  No gross abnormality    Impression:  1) Esotropia, recurrent (consecutive)   2) DVD OU/IO OA OU but pt doesn't notice/isn't bothered   3) myopia  Plan: Advance left lateral rectus muscle.  No obliques for now (at pt request)  Shara BlazingYOUNG,Isiaha Greenup O

## 2016-07-15 ENCOUNTER — Encounter (HOSPITAL_COMMUNITY): Payer: Self-pay | Admitting: *Deleted

## 2016-07-15 ENCOUNTER — Encounter (HOSPITAL_COMMUNITY)
Admission: RE | Admit: 2016-07-15 | Discharge: 2016-07-15 | Disposition: A | Discharge status: Home / Self Care | Payer: Medicaid Other | Source: Ambulatory Visit | Attending: Ophthalmology | Admitting: Ophthalmology

## 2016-07-15 DIAGNOSIS — Z01812 Encounter for preprocedural laboratory examination: Secondary | ICD-10-CM | POA: Diagnosis not present

## 2016-07-15 HISTORY — DX: Acute pancreatitis without necrosis or infection, unspecified: K85.90

## 2016-07-15 HISTORY — DX: Mononeuropathy, unspecified: G58.9

## 2016-07-15 HISTORY — DX: Anxiety disorder, unspecified: F41.9

## 2016-07-15 HISTORY — DX: Dyspnea, unspecified: R06.00

## 2016-07-15 LAB — CBC
HCT: 40.1 % (ref 36.0–46.0)
Hemoglobin: 13.1 g/dL (ref 12.0–15.0)
MCH: 29.9 pg (ref 26.0–34.0)
MCHC: 32.7 g/dL (ref 30.0–36.0)
MCV: 91.6 fL (ref 78.0–100.0)
PLATELETS: 239 10*3/uL (ref 150–400)
RBC: 4.38 MIL/uL (ref 3.87–5.11)
RDW: 13 % (ref 11.5–15.5)
WBC: 8.7 10*3/uL (ref 4.0–10.5)

## 2016-07-15 LAB — HCG, SERUM, QUALITATIVE: Preg, Serum: NEGATIVE

## 2016-07-15 NOTE — Progress Notes (Signed)
[facility] [place] Dr. Okey Duprerawford  Sleep study 06/23/15 pt. Reports  Neg test  Had stress test 8 plus yrs. Ago for chest pain ([place] [facility])-states normal, din't have to follow up and no problems since.

## 2016-07-15 NOTE — Pre-Procedure Instructions (Signed)
    Sarah SmilingKimberly D French  07/15/2016      [id number]- [place], [place] - [address] [place] [id number] Phone: [phone] Fax: [phone]    Your procedure is scheduled on Thurs. Nov. 30  [facility] Ad[facility] at 9:30 A.M.  Call this number if you have problems the morning of surgery:  [phone]   Remember:  Do not eat food or drink liquids after midnight.  Take these medicines the morning of surgery with A SIP OF WATER : flonase  Nasal spray,eye drops             Stop advil, motrin, ibuprofen,aleve, BC Powders,Goody's, fish oil, vitamins/herbal medicines.   Do not wear jewelry, make-up or nail polish.  Do not wear lotions, powders, or perfumes, or deoderant.  Do not shave 48 hours prior to surgery.  Men may shave face and neck.  Do not bring valuables to the hospital.  [facility] is not responsible for any belongings or valuables.  Contacts, dentures or bridgework may not be worn into surgery.  Leave your suitcase in the car.  After surgery it may be brought to your room.  For patients admitted to the hospital, discharge time will be determined by your treatment team.  Patients discharged the day of surgery will not be allowed to drive home.   Name and phone number of your driver:   Special instructions: review preparing for surgery  Please read over the following fact sheets that you were given.

## 2016-07-23 ENCOUNTER — Ambulatory Visit (HOSPITAL_COMMUNITY)
Admission: RE | Admit: 2016-07-23 | Discharge: 2016-07-23 | Disposition: A | Discharge status: Home / Self Care | Payer: Medicaid Other | Source: Ambulatory Visit | Attending: Ophthalmology | Admitting: Ophthalmology

## 2016-07-23 ENCOUNTER — Encounter (HOSPITAL_COMMUNITY): Payer: Self-pay | Admitting: Anesthesiology

## 2016-07-23 ENCOUNTER — Encounter (HOSPITAL_COMMUNITY)
Admission: RE | Disposition: A | Discharge status: Home / Self Care | Payer: Self-pay | Source: Ambulatory Visit | Attending: Ophthalmology

## 2016-07-23 ENCOUNTER — Ambulatory Visit (HOSPITAL_COMMUNITY): Payer: Medicaid Other | Admitting: Emergency Medicine

## 2016-07-23 ENCOUNTER — Ambulatory Visit (HOSPITAL_COMMUNITY): Payer: Medicaid Other | Admitting: Certified Registered Nurse Anesthetist

## 2016-07-23 DIAGNOSIS — Z6841 Body Mass Index (BMI) 40.0 and over, adult: Secondary | ICD-10-CM | POA: Insufficient documentation

## 2016-07-23 DIAGNOSIS — Z8249 Family history of ischemic heart disease and other diseases of the circulatory system: Secondary | ICD-10-CM | POA: Diagnosis not present

## 2016-07-23 DIAGNOSIS — Z820 Family history of epilepsy and other diseases of the nervous system: Secondary | ICD-10-CM | POA: Diagnosis not present

## 2016-07-23 DIAGNOSIS — H5000 Unspecified esotropia: Secondary | ICD-10-CM | POA: Diagnosis present

## 2016-07-23 DIAGNOSIS — Z7951 Long term (current) use of inhaled steroids: Secondary | ICD-10-CM | POA: Diagnosis not present

## 2016-07-23 DIAGNOSIS — Z87891 Personal history of nicotine dependence: Secondary | ICD-10-CM | POA: Diagnosis not present

## 2016-07-23 DIAGNOSIS — Z8261 Family history of arthritis: Secondary | ICD-10-CM | POA: Insufficient documentation

## 2016-07-23 DIAGNOSIS — Z79899 Other long term (current) drug therapy: Secondary | ICD-10-CM | POA: Diagnosis not present

## 2016-07-23 HISTORY — PX: STRABISMUS SURGERY: SHX218

## 2016-07-23 SURGERY — STRABISMUS SURGERY, BILATERAL
Anesthesia: General | Laterality: Right

## 2016-07-23 MED ORDER — MIDAZOLAM HCL 5 MG/5ML IJ SOLN
INTRAMUSCULAR | Status: DC | PRN
  Administered 2016-07-23: 2 mg via INTRAVENOUS

## 2016-07-23 MED ORDER — ROCURONIUM BROMIDE 10 MG/ML (PF) SYRINGE
PREFILLED_SYRINGE | INTRAVENOUS | Status: DC | PRN
  Administered 2016-07-23: 10 mg via INTRAVENOUS
  Administered 2016-07-23: 50 mg via INTRAVENOUS

## 2016-07-23 MED ORDER — SODIUM CHLORIDE 0.9 % IV SOLN
INTRAVENOUS | Status: DC
  Administered 2016-07-23 (×2): via INTRAVENOUS

## 2016-07-23 MED ORDER — MIDAZOLAM HCL 2 MG/2ML IJ SOLN
INTRAMUSCULAR | Status: AC
  Filled 2016-07-23: qty 2

## 2016-07-23 MED ORDER — FENTANYL CITRATE (PF) 100 MCG/2ML IJ SOLN
INTRAMUSCULAR | Status: AC
  Filled 2016-07-23: qty 2

## 2016-07-23 MED ORDER — PHENYLEPHRINE HCL 2.5 % OP SOLN
OPHTHALMIC | Status: AC
  Filled 2016-07-23: qty 2

## 2016-07-23 MED ORDER — PROPOFOL 10 MG/ML IV BOLUS
INTRAVENOUS | Status: DC | PRN
  Administered 2016-07-23: 200 mg via INTRAVENOUS

## 2016-07-23 MED ORDER — LIDOCAINE-EPINEPHRINE 2 %-1:100000 IJ SOLN
INTRAMUSCULAR | Status: AC
  Filled 2016-07-23: qty 1

## 2016-07-23 MED ORDER — LIDOCAINE 2% (20 MG/ML) 5 ML SYRINGE
INTRAMUSCULAR | Status: DC | PRN
  Administered 2016-07-23: 50 mg via INTRAVENOUS

## 2016-07-23 MED ORDER — TETRACAINE HCL 0.5 % OP SOLN
OPHTHALMIC | Status: AC
Start: 2016-07-23 — End: 2016-07-23
  Filled 2016-07-23: qty 2

## 2016-07-23 MED ORDER — 0.9 % SODIUM CHLORIDE (POUR BTL) OPTIME
TOPICAL | Status: DC | PRN
  Administered 2016-07-23: 1000 mL

## 2016-07-23 MED ORDER — LACTATED RINGERS IV SOLN: INTRAVENOUS | Status: DC

## 2016-07-23 MED ORDER — MEPERIDINE HCL 25 MG/ML IJ SOLN: 6.2500 mg | INTRAMUSCULAR | Status: DC | PRN

## 2016-07-23 MED ORDER — LIDOCAINE HCL 2 % IJ SOLN
INTRAMUSCULAR | Status: AC
  Filled 2016-07-23: qty 20

## 2016-07-23 MED ORDER — GLYCOPYRROLATE 0.2 MG/ML IJ SOLN
INTRAMUSCULAR | Status: DC | PRN
  Administered 2016-07-23: 0.1 mg via INTRAVENOUS
  Administered 2016-07-23: 0.6 mg via INTRAVENOUS

## 2016-07-23 MED ORDER — PROMETHAZINE HCL 25 MG/ML IJ SOLN
6.2500 mg | INTRAMUSCULAR | Status: DC | PRN
  Administered 2016-07-23: 6.25 mg via INTRAVENOUS

## 2016-07-23 MED ORDER — HYDROMORPHONE HCL 1 MG/ML IJ SOLN
0.2500 mg | INTRAMUSCULAR | Status: DC | PRN
  Administered 2016-07-23: 0.5 mg via INTRAVENOUS

## 2016-07-23 MED ORDER — BSS IO SOLN
INTRAOCULAR | Status: DC | PRN
Start: 2016-07-23 — End: 2016-07-23
  Administered 2016-07-23: 15 mL via INTRAOCULAR

## 2016-07-23 MED ORDER — FENTANYL CITRATE (PF) 100 MCG/2ML IJ SOLN
INTRAMUSCULAR | Status: DC | PRN
  Administered 2016-07-23 (×4): 50 ug via INTRAVENOUS

## 2016-07-23 MED ORDER — LACTATED RINGERS IV SOLN
INTRAVENOUS | Status: DC | PRN
  Administered 2016-07-23: 13:00:00 via INTRAVENOUS

## 2016-07-23 MED ORDER — TOBRAMYCIN-DEXAMETHASONE 0.3-0.1 % OP OINT
TOPICAL_OINTMENT | OPHTHALMIC | Status: AC
  Filled 2016-07-23: qty 3.5

## 2016-07-23 MED ORDER — NEOSTIGMINE METHYLSULFATE 10 MG/10ML IV SOLN
INTRAVENOUS | Status: DC | PRN
Start: 2016-07-23 — End: 2016-07-23
  Administered 2016-07-23: 5 mg via INTRAVENOUS

## 2016-07-23 MED ORDER — HYDROMORPHONE HCL 1 MG/ML IJ SOLN
INTRAMUSCULAR | Status: AC
  Filled 2016-07-23: qty 0.5

## 2016-07-23 MED ORDER — BSS IO SOLN
INTRAOCULAR | Status: AC
  Filled 2016-07-23: qty 15

## 2016-07-23 MED ORDER — ONDANSETRON HCL 4 MG/2ML IJ SOLN
INTRAMUSCULAR | Status: DC | PRN
  Administered 2016-07-23: 4 mg via INTRAVENOUS

## 2016-07-23 MED ORDER — PROMETHAZINE HCL 25 MG/ML IJ SOLN
INTRAMUSCULAR | Status: AC
  Filled 2016-07-23: qty 1

## 2016-07-23 MED ORDER — TOBRAMYCIN-DEXAMETHASONE 0.3-0.1 % OP OINT: 1.0000 "application " | TOPICAL_OINTMENT | Freq: Two times a day (BID) | OPHTHALMIC | 0 refills | Status: DC

## 2016-07-23 SURGICAL SUPPLY — 37 items
APL SRG 3 HI ABS STRL LF PLS (MISCELLANEOUS) ×1
APPLICATOR COTTON TIP 6IN STRL (MISCELLANEOUS) ×3 IMPLANT
APPLICATOR DR MATTHEWS STRL (MISCELLANEOUS) ×3 IMPLANT
BLADE SURG 15 STRL LF DISP TIS (BLADE) IMPLANT
BLADE SURG 15 STRL SS (BLADE)
BNDG CONFORM 3 STRL LF (GAUZE/BANDAGES/DRESSINGS) IMPLANT
CLOSURE WOUND 1/2 X4 (GAUZE/BANDAGES/DRESSINGS)
CORDS BIPOLAR (ELECTRODE) IMPLANT
COVER SURGICAL LIGHT HANDLE (MISCELLANEOUS) ×3 IMPLANT
DRAPE SURG 17X23 STRL (DRAPES) ×6 IMPLANT
GLOVE BIO SURGEON STRL SZ7.5 (GLOVE) ×6 IMPLANT
GLOVE BIOGEL PI IND STRL 8 (GLOVE) ×1 IMPLANT
GLOVE BIOGEL PI INDICATOR 8 (GLOVE) ×2
GOWN STRL REUS W/ TWL LRG LVL3 (GOWN DISPOSABLE) ×1 IMPLANT
GOWN STRL REUS W/ TWL XL LVL3 (GOWN DISPOSABLE) ×1 IMPLANT
GOWN STRL REUS W/TWL LRG LVL3 (GOWN DISPOSABLE) ×3
GOWN STRL REUS W/TWL XL LVL3 (GOWN DISPOSABLE) ×3
KIT BASIN OR (CUSTOM PROCEDURE TRAY) ×3 IMPLANT
KIT ROOM TURNOVER OR (KITS) ×3 IMPLANT
MARKER SKIN DUAL TIP RULER LAB (MISCELLANEOUS) IMPLANT
NDL PRECISIONGLIDE 27X1.5 (NEEDLE) IMPLANT
NEEDLE PRECISIONGLIDE 27X1.5 (NEEDLE) IMPLANT
NS IRRIG 1000ML POUR BTL (IV SOLUTION) ×3 IMPLANT
PACK CATARACT CUSTOM (CUSTOM PROCEDURE TRAY) ×3 IMPLANT
PAD ARMBOARD 7.5X6 YLW CONV (MISCELLANEOUS) ×6 IMPLANT
STRIP CLOSURE SKIN 1/2X4 (GAUZE/BANDAGES/DRESSINGS) IMPLANT
SUT MERSILENE 5 0 RD 1 DA (SUTURE) IMPLANT
SUT PLAIN 6 0 TG1408 (SUTURE) ×2 IMPLANT
SUT SILK 6 0 G 6 (SUTURE) IMPLANT
SUT VICRYL 6 0 S 14 UNDY (SUTURE) IMPLANT
SUT VICRYL 6 0 S 28 (SUTURE) IMPLANT
SUT VICRYL 6 0 UNDY PS 6 (SUTURE) IMPLANT
SUT VICRYL ABS 6-0 S29 18IN (SUTURE) IMPLANT
SYR BULB 3OZ (MISCELLANEOUS) ×2 IMPLANT
TOWEL OR 17X24 6PK STRL BLUE (TOWEL DISPOSABLE) ×6 IMPLANT
WATER STERILE IRR 1000ML POUR (IV SOLUTION) ×3 IMPLANT
WIPE INSTRUMENT VISIWIPE 73X73 (MISCELLANEOUS) ×3 IMPLANT

## 2016-07-23 NOTE — Interval H&P Note (Signed)
History and Physical Interval Note:  07/23/2016 11:45 AM  Sarah French  has presented today for surgery, with the diagnosis of ESOTROPIA  The various methods of treatment have been discussed with the patient and family. After consideration of risks, benefits and other options for treatment, the patient has consented to  Procedure(s): REPAIR STRABISMUS RIGHT (Right) as a surgical intervention .  The patient's history has been reviewed, patient examined, no change in status, stable for surgery.  I have reviewed the patient's chart and labs.  Questions were answered to the patient's satisfaction.     Shara BlazingYOUNG,Dragon Thrush O

## 2016-07-23 NOTE — H&P (View-Only) (Signed)
Date of examination:  06-03-16  Indication for surgery: to straighten the eyes and allow some binocularity  Pertinent past medical history:  Past Medical History:  Diagnosis Date  . Dry eyes   . Hypoventilation associated with obesity syndrome (HCC)   . Migraines   . Morbidly obese (HCC)   . 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 Hypertension Mother   . Hypertension Father   . Arthritis Sister   . Migraines Maternal Grandmother     General:  Healthy appearing patient in no distress.    Eyes:    Acuity  cc OD 20/20  OS 20/20  External: Within normal limits     Anterior segment: Within normal limits  X healed conj scars OU  Motility:   ET=10, ET'=17.  IO OA OU, RHT in L gaze, LHT in R gaze  Fundus: Normal     Refraction:   -5 SE OU approx    Systemic:  No respiratory difficulty.  No gross abnormality    Impression:  1) Esotropia, recurrent (consecutive)   2) DVD OU/IO OA OU but pt doesn't notice/isn't bothered   3) myopia  Plan: Advance left lateral rectus muscle.  No obliques for now (at pt request)  Sarah French O  

## 2016-07-23 NOTE — H&P (Signed)
Interval History and Physical Examination:  Sarah French  07/23/2016  Date of Initial H&P: 06-03-16   The patient has been reexamined. The H&P has been reviewed. There is no change in the plan of care.  The patient has no new complaints. The indications for today's procedure remain valid. There are no medical contraindications for proceeding with today's planned surgery and we will proceed as planned.  Sarah French,Sarah French

## 2016-07-23 NOTE — Discharge Instructions (Signed)
Diet: Clear liquids, advance to soft foods then regular diet as tolerated by this evening.  Pain control:   1)  Ibuprofen 600 mg by mouth every 6-8 hours as needed for pain  2)  Ice pack/cold compress to operated eye(s) as desired  Eye medications:    Tobradex or Zylet eye ointment 1/2 inch in operated eye(s) twice a day if directed to do so by Dr. Maple HudsonYoung  Activity: No swimming for 1 week.  It is OK to let water run over the face and eyes while showering or taking a bath, even during the first week.  No other restriction on exercise or activity.  If there is a patch on one eye, leave it in place until seen in Dr. Roxy CedarYoung's office this afternoon for suture adjustment.  If there is no patch, you do not need to come to the office this afternoon; just come for your scheduled postop appointment.  Call Dr. Roxy CedarYoung's office [phone] with any problems or concerns.

## 2016-07-23 NOTE — Anesthesia Procedure Notes (Signed)
Procedure Name: Intubation Date/Time: 07/23/2016 12:04 PM Performed by: Annabelle HarmanSMITH, Tericka Devincenzi A Pre-anesthesia Checklist: Patient identified, Emergency Drugs available, Suction available, Patient being monitored and Timeout performed Patient Re-evaluated:Patient Re-evaluated prior to inductionOxygen Delivery Method: Circle system utilized Preoxygenation: Pre-oxygenation with 100% oxygen Intubation Type: IV induction Ventilation: Mask ventilation without difficulty Laryngoscope Size: Mac and 4 Grade View: Grade I Tube type: Oral Tube size: 7.0 mm Number of attempts: 1 Airway Equipment and Method: Stylet Placement Confirmation: ETT inserted through vocal cords under direct vision,  positive ETCO2 and breath sounds checked- equal and bilateral Secured at: 22 cm Tube secured with: Tape Dental Injury: Teeth and Oropharynx as per pre-operative assessment

## 2016-07-23 NOTE — Op Note (Signed)
07/23/2016  1:06 PM  PATIENT:  Sarah French    PRE-OPERATIVE DIAGNOSIS:  ESOTROPIA, consecutive (recurrent)  POST-OPERATIVE DIAGNOSIS:  same  PROCEDURE:  Right lateral rectus muscle advancement 4.0 mm/resection 3.640mm  SURGEON:  Shara BlazingYOUNG,Juluis Fitzsimmons O, MD  ANESTHESIA:   General  COMPLICATIONS: none  OPERATIVE PROCEDURE: After routine preoperative evaluation including informed consent, the patient was taken to the operating room where she was identified by me. General anesthesia was induced without difficulty after placement of appropriate monitors. The patient was prepped and draped in standard sterile fashion. A lid speculum was placed in the right eye.  A limbal conjunctival peritomy of 2 clock hours extent was made temporally in the right eye with Wescott scissors, with relaxing incisions in the superotemporal and inferotemporal quadrants. The previously recessed right lateral rectus muscle was hooked from its superior side, to avoid interference with the inferior oblique muscle. The muscle was cleared of its surrounding fascial attachments and scar tissue to at least 15 mm posterior to the current insertion. The muscle was secured with a double-armed 6-0 Vicryl suture, beginning by placing a knot at the center of the muscle 3.0 mm posterior to the current insertion, then passing the needle at each end of the double-armed suture from the center of the muscle belly to the periphery, parallel to and 3.0 mm posterior to the current insertion, with a locking bite at each border of the muscle. Resection clamp was placed on the muscle just anterior to the sutures. The muscle was disinserted. The current insertion was measured to be 11 mm posterior to the limbus, suggesting that the previous recession was 4 mm.  Each pole suture was passed posteriorly to anteriorly through the corresponding end of the original muscle stump (7 mm posterior to the limbus), then anteriorly to posteriorly near the center of  the stump, then posteriorly to anteriorly through the center of the muscle belly, just posterior to the previously placed knot. The muscle was drawn up to the level of the original insertion and all slack was removed before the suture ends were tied securely. The conjunctival flap was reapposed to the limbus after amputating the anterior 2 mm of the flap, securing the flap with multiple 6-0 plain gut sutures. Tobradex ointment was placed in the eye. The patient was awakened without difficulty and taken to the recovery room in stable condition, having suffered no intraoperative or immediate postoperative complications.  Shara BlazingYOUNG,Sarah Kleinpeter O, MD

## 2016-07-23 NOTE — Transfer of Care (Signed)
Immediate Anesthesia Transfer of Care Note  Patient: Sarah French  Procedure(s) Performed: Procedure(s): REPAIR STRABISMUS RIGHT (Right)  Patient Location: PACU  Anesthesia Type:General  Level of Consciousness: awake, alert , oriented and patient cooperative  Airway & Oxygen Therapy: Patient Spontanous Breathing and Patient connected to nasal cannula oxygen  Post-op Assessment: Report given to RN, Post -op Vital signs reviewed and stable and Patient moving all extremities X 4  Post vital signs: Reviewed and stable  Last Vitals:  Vitals:   07/23/16 1004 07/23/16 1317  BP: 124/72 (!) (P) 142/67  Pulse: 76   Resp: 20 (P) 18  Temp: 36.8 C (P) 36.8 C    Last Pain:  Vitals:   07/23/16 1004  TempSrc: Oral         Complications: No apparent anesthesia complications

## 2016-07-23 NOTE — Anesthesia Preprocedure Evaluation (Addendum)
Anesthesia Evaluation  Patient identified by MRN, date of birth, ID band Patient awake    Reviewed: Allergy & Precautions, NPO status , Patient's Chart, lab work & pertinent test results  Airway Mallampati: II  TM Distance: >3 FB Neck ROM: Full    Dental  (+) Teeth Intact, Dental Advisory Given   Pulmonary former smoker,    breath sounds clear to auscultation       Cardiovascular negative cardio ROS   Rhythm:Regular Rate:Normal     Neuro/Psych  Headaches, PSYCHIATRIC DISORDERS Anxiety    GI/Hepatic negative GI ROS, Neg liver ROS,   Endo/Other  negative endocrine ROS  Renal/GU negative Renal ROS  negative genitourinary   Musculoskeletal negative musculoskeletal ROS (+)   Abdominal   Peds negative pediatric ROS (+)  Hematology negative hematology ROS (+)   Anesthesia Other Findings   Reproductive/Obstetrics negative OB ROS                            Lab Results  Component Value Date   WBC 8.7 07/15/2016   HGB 13.1 07/15/2016   HCT 40.1 07/15/2016   MCV 91.6 07/15/2016   PLT 239 07/15/2016   Lab Results  Component Value Date   CREATININE 0.9 10/30/2008   BUN 8 10/30/2008   NA 140 10/30/2008   K 3.9 10/30/2008   CL 104 10/30/2008   No results found for: INR, PROTIME   Anesthesia Physical Anesthesia Plan  ASA: III  Anesthesia Plan: General   Post-op Pain Management:    Induction: Intravenous  Airway Management Planned: Oral ETT  Additional Equipment:   Intra-op Plan:   Post-operative Plan: Extubation in OR  Informed Consent: I have reviewed the patients History and Physical, chart, labs and discussed the procedure including the risks, benefits and alternatives for the proposed anesthesia with the patient or authorized representative who has indicated his/her understanding and acceptance.   Dental advisory given  Plan Discussed with: CRNA  Anesthesia Plan  Comments:        Anesthesia Quick Evaluation

## 2016-07-23 NOTE — Anesthesia Procedure Notes (Signed)
Performed by: Mekiah Wahler A       

## 2016-07-23 NOTE — Progress Notes (Signed)
Report given to erika braddock rn as caregiver 

## 2016-07-23 NOTE — Anesthesia Postprocedure Evaluation (Signed)
Anesthesia Post Note  Patient: Lynden AngKimberly D Popescu  Procedure(s) Performed: Procedure(s) (LRB): REPAIR STRABISMUS RIGHT (Right)  Patient location during evaluation: PACU Anesthesia Type: General Level of consciousness: awake and alert Pain management: pain level controlled Vital Signs Assessment: post-procedure vital signs reviewed and stable Respiratory status: spontaneous breathing, nonlabored ventilation, respiratory function stable and patient connected to nasal cannula oxygen Cardiovascular status: blood pressure returned to baseline and stable Postop Assessment: no signs of nausea or vomiting Anesthetic complications: no    Last Vitals:  Vitals:   07/23/16 1500 07/23/16 1619  BP: 125/70 (!) 141/71  Pulse: 64 95  Resp: 15 20  Temp:      Last Pain:  Vitals:   07/23/16 1445  TempSrc:   PainSc: Asleep                 Shelton SilvasKevin D Hollis

## 2016-07-24 ENCOUNTER — Encounter (HOSPITAL_COMMUNITY): Payer: Self-pay | Admitting: Ophthalmology

## 2016-10-05 HISTORY — PX: LAPAROSCOPIC GASTRIC BYPASS: SUR771

## 2016-10-26 ENCOUNTER — Other Ambulatory Visit: Payer: Self-pay | Admitting: Surgical Oncology

## 2016-10-26 DIAGNOSIS — R1310 Dysphagia, unspecified: Secondary | ICD-10-CM

## 2016-10-28 ENCOUNTER — Ambulatory Visit
Admission: RE | Admit: 2016-10-28 | Discharge: 2016-10-28 | Disposition: A | Discharge status: Home / Self Care | Payer: Medicaid Other | Source: Ambulatory Visit | Attending: Surgical Oncology | Admitting: Surgical Oncology

## 2016-10-28 DIAGNOSIS — R1310 Dysphagia, unspecified: Secondary | ICD-10-CM

## 2016-11-03 ENCOUNTER — Encounter (HOSPITAL_COMMUNITY): Payer: Self-pay | Admitting: Emergency Medicine

## 2016-11-03 ENCOUNTER — Emergency Department (HOSPITAL_COMMUNITY)
Admission: EM | Admit: 2016-11-03 | Discharge: 2016-11-04 | Disposition: A | Discharge status: Home / Self Care | Payer: Medicaid Other | Attending: Emergency Medicine | Admitting: Emergency Medicine

## 2016-11-03 ENCOUNTER — Emergency Department (HOSPITAL_COMMUNITY): Payer: Medicaid Other

## 2016-11-03 DIAGNOSIS — Z87891 Personal history of nicotine dependence: Secondary | ICD-10-CM | POA: Diagnosis not present

## 2016-11-03 DIAGNOSIS — E860 Dehydration: Secondary | ICD-10-CM | POA: Insufficient documentation

## 2016-11-03 DIAGNOSIS — R110 Nausea: Secondary | ICD-10-CM

## 2016-11-03 LAB — BASIC METABOLIC PANEL
ANION GAP: 16 — AB (ref 5–15)
BUN: <5 mg/dL — ABNORMAL LOW (ref 6–20)
CHLORIDE: 98 mmol/L — AB (ref 101–111)
CO2: 25 mmol/L (ref 22–32)
Calcium: 8.9 mg/dL (ref 8.9–10.3)
Creatinine, Ser: 0.64 mg/dL (ref 0.44–1.00)
GFR calc non Af Amer: >60 mL/min (ref >60)
Glucose, Bld: 100 mg/dL — ABNORMAL HIGH (ref 65–99)
Potassium: 3.5 mmol/L (ref 3.5–5.1)
Sodium: 139 mmol/L (ref 135–145)

## 2016-11-03 LAB — URINALYSIS, ROUTINE W REFLEX MICROSCOPIC
Glucose, UA: NEGATIVE mg/dL
KETONES UR: 80 mg/dL — AB
NITRITE: NEGATIVE
Protein, ur: 100 mg/dL — AB
Specific Gravity, Urine: 1.031 — ABNORMAL HIGH (ref 1.005–1.030)
pH: 6 (ref 5.0–8.0)

## 2016-11-03 LAB — CBC
HCT: 39.2 % (ref 36.0–46.0)
HEMOGLOBIN: 12.7 g/dL (ref 12.0–15.0)
MCH: 29.1 pg (ref 26.0–34.0)
MCHC: 32.4 g/dL (ref 30.0–36.0)
MCV: 89.9 fL (ref 78.0–100.0)
Platelets: 250 10*3/uL (ref 150–400)
RBC: 4.36 MIL/uL (ref 3.87–5.11)
RDW: 13.6 % (ref 11.5–15.5)
WBC: 9.5 10*3/uL (ref 4.0–10.5)

## 2016-11-03 LAB — I-STAT TROPONIN, ED: Troponin i, poc: 0 ng/mL (ref 0.00–0.08)

## 2016-11-03 MED ORDER — PROCHLORPERAZINE EDISYLATE 5 MG/ML IJ SOLN
10.0000 mg | Freq: Once | INTRAMUSCULAR | Status: AC
  Administered 2016-11-03: 10 mg via INTRAVENOUS
  Filled 2016-11-03: qty 2

## 2016-11-03 MED ORDER — SODIUM CHLORIDE 0.9 % IV BOLUS (SEPSIS)
1000.0000 mL | Freq: Once | INTRAVENOUS | Status: AC
  Administered 2016-11-03: 1000 mL via INTRAVENOUS

## 2016-11-03 MED ORDER — KCL IN DEXTROSE-NACL 20-5-0.45 MEQ/L-%-% IV SOLN
Freq: Once | INTRAVENOUS | Status: AC
  Administered 2016-11-03: 23:00:00 via INTRAVENOUS
  Filled 2016-11-03: qty 1000

## 2016-11-03 MED ORDER — GI COCKTAIL ~~LOC~~
30.0000 mL | Freq: Once | ORAL | Status: AC
  Administered 2016-11-03: 30 mL via ORAL
  Filled 2016-11-03: qty 30

## 2016-11-03 MED ORDER — PROCHLORPERAZINE MALEATE 10 MG PO TABS: 10.0000 mg | ORAL_TABLET | Freq: Two times a day (BID) | ORAL | 0 refills | Status: DC | PRN

## 2016-11-03 NOTE — ED Triage Notes (Signed)
Pt c/o epigastric/CP since last night, intermittent, no radiation. Pt denies SOB/dizziness/vomiting/weakness. Pt also c/o nausea x 3 weeks - states she had gastric bypass 4 weeks ago and it's been constant nausea since - has tried phenergan and scopolamine patches, with no relief. Denies vomiting, but endorses decreased oral intake secondary to nausea. Pt A&O x 4, resp e/u, skin warm/dry.

## 2016-11-03 NOTE — ED Provider Notes (Signed)
[facility]ENCY DEPT Provider Note   CSN: [id number] Arrival date & time: 11/03/16  1555     History   Chief Complaint Chief Complaint  Patient presents with  . Abdominal Pain  . Nausea    HPI Sarah French is a 36 y.o. female.  HPI Patient presents with concern of ongoing nausea, anorexia, and new chest pain. Patient has a notable history of gastric bypass surgery 4 weeks ago, at a different healthcare facility. She notes that since that time she has had persistent nausea, anorexia. This has not changed in spite of 3 different antiemetics. Patient notes that she drinks, uses minimal amounts each day, has lost 47 pounds. Over the past day patient has also developed sternal chest discomfort, tongue discoloration. No new dyspnea, no fever, no chills, no vomiting. Chest pain is nonradiating, sharp, burning. Today, the patient was prescribed new antiemetics, after her most recent attempt, with scopolamine, failed. She has not yet tried either of these new medications.   Past Medical History:  Diagnosis Date  . Anxiety   . Dry eyes   . Dyspnea    on exertion sometimes  . Hypoventilation associated with obesity syndrome (HCC)   . Migraines   . Morbidly obese (HCC)   . Obesity   . Pancreatitis 2006  . Pinched nerve 2014   left leg    Patient Active Problem List   Diagnosis Date Noted  . Other seasonal allergic rhinitis 07/02/2015  . Sleep related headaches 06/04/2015  . Gasping for breath 06/04/2015  . Snoring 06/04/2015  . Morbid obesity due to excess calories (HCC) 06/04/2015  . Status post nail surgery 02/27/2015  . Ingrown nail 02/22/2015  . Pain in lower limb 02/22/2015    Past Surgical History:  Procedure Laterality Date  . CHOLECYSTECTOMY  2006  . EYE SURGERY     as infant and child  . FRACTURE SURGERY Left 2004   ankle  . 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 STRABISMUS SURGERY Right 07/23/2016   Procedure: REPAIR STRABISMUS RIGHT;  Surgeon:  Verne CarrowWilliam Young, MD;  [facility];  Service: Ophthalmology;  Laterality: Right;  . widson teeth  2004    OB History    No data available       Home Medications    Prior to Admission medications   Medication Sig Start Date End Date Taking? Authorizing Provider  Biotin 5 MG TABS Take 5 mg by mouth daily.     Historical Provider, MD  Calcium Carbonate-Vit D-Min (CALTRATE 600+D PLUS MINERALS) 600-800 MG-UNIT CHEW Chew 1 tablet by mouth daily.     Historical Provider, MD  cephALEXin (KEFLEX) 500 MG capsule Take 1 capsule by mouth every 6 (six) hours. 06/26/16   Historical Provider, MD  Cholecalciferol (VITAMIN D) 2000 units CAPS Take 1 capsule by mouth daily.     Historical Provider, MD  fluticasone (FLONASE) 50 MCG/ACT nasal spray Place 1 spray into both nostrils daily.     Historical Provider, MD  Multiple Vitamin (MULTIVITAMIN) tablet Take 1 tablet by mouth daily.    Historical Provider, MD  Omega-3 Fatty Acids (FISH OIL) 1000 MG CAPS Take 1 capsule by mouth daily.     Historical Provider, MD  Polyethyl Glycol-Propyl Glycol (SYSTANE OP) Place 1 drop into both eyes daily.    Historical Provider, MD  tobramycin-dexamethasone Wallene Dales(TOBRADEX) ophthalmic ointment Place 1 application into the right eye 2 (two) times daily. 07/23/16   Verne CarrowWilliam Young, MD  vitamin B-12 (CYANOCOBALAMIN) 250 MCG tablet  Take 250 mcg by mouth daily.    Historical Provider, MD  vitamin C (ASCORBIC ACID) 500 MG tablet Take 500 mg by mouth daily.    Historical Provider, MD    Family History Family History  Problem Relation Age of Onset  . Hypertension Mother   . Hypertension Father   . Arthritis Sister   . Migraines Maternal Grandmother     Social History Social History  Substance Use Topics  . Smoking status: Former Smoker    Types: Cigarettes    Quit date: 2010  . Smokeless tobacco: Never Used  . Alcohol use No     Allergies   Nsaids; Topiramate; and Zithromax [azithromycin]   Review of Systems Review  of Systems  Constitutional:       Per HPI, otherwise negative  HENT:       Per HPI, otherwise negative  Respiratory:       Per HPI, otherwise negative  Cardiovascular:       Per HPI, otherwise negative  Gastrointestinal: Positive for abdominal pain and nausea. Negative for vomiting.  Endocrine:       Negative aside from HPI  Genitourinary:       Neg aside from HPI   Musculoskeletal:       Per HPI, otherwise negative  Skin: Negative.   Allergic/Immunologic: Negative for immunocompromised state.  Neurological: Negative for syncope.     Physical Exam Updated Vital Signs BP 117/70 (BP Location: Right Arm)   Pulse 84   Temp 98.9 F (37.2 C) (Oral)   Resp 16   Ht 5\' 4"  (1.626 m)   Wt 253 lb 4.8 oz (114.9 kg)   LMP 11/01/2016 (Exact Date)   SpO2 98%   BMI 43.48 kg/m   Physical Exam  Constitutional: She is oriented to person, place, and time. She appears well-developed and well-nourished. No distress.  Obese F sitting upright in NAD  HENT:  Head: Normocephalic and atraumatic.  Tongue whitish.  Eyes: Conjunctivae and EOM are normal.  Cardiovascular: Normal rate and regular rhythm.   Pulmonary/Chest: Effort normal and breath sounds normal. No stridor. No respiratory distress.  Abdominal: She exhibits no distension.  Large nontender abdomen.  Surgical scars unremarkable  Musculoskeletal: She exhibits no edema.  Neurological: She is alert and oriented to person, place, and time. No cranial nerve deficit.  Skin: Skin is warm and dry.  Psychiatric: She has a normal mood and affect.  Nursing note and vitals reviewed.    ED Treatments / Results  Labs (all labs ordered are listed, but only abnormal results are displayed) Labs Reviewed  BASIC METABOLIC PANEL - Abnormal; Notable for the following:       Result Value   Chloride 98 (*)    Glucose, Bld 100 (*)    BUN <5 (*)    Anion gap 16 (*)    All other components within normal limits  CBC  URINALYSIS, ROUTINE W REFLEX  MICROSCOPIC  I-STAT TROPOININ, ED    EKG  EKG Interpretation  Date/Time:  Tuesday November 03 2016 16:01:34 EDT Ventricular Rate:  81 PR Interval:  142 QRS Duration: 78 QT Interval:  360 QTC Calculation: 418 R Axis:   53 Text Interpretation:  Normal sinus rhythm Low voltage QRS T wave abnormality Abnormal ekg Confirmed by Gerhard Munch  MD ([id number]) on 11/03/2016 8:19:58 PM       Radiology Dg Chest 2 View  Result Date: 11/03/2016 CLINICAL DATA:  Epigastric chest pain EXAM: CHEST  2 VIEW  COMPARISON:  10/30/2008 FINDINGS: The heart size and mediastinal contours are within normal limits. Both lungs are clear. The visualized skeletal structures are unremarkable. Surgical clips in the right upper quadrant. IMPRESSION: No active cardiopulmonary disease. Electronically Signed   By: Jasmine Pang M.D.   On: 11/03/2016 16:53    Procedures Procedures (including critical care time)  Medications Ordered in ED Medications  sodium chloride 0.9 % bolus 1,000 mL (not administered)  gi cocktail (Maalox,Lidocaine,Donnatal) (not administered)  prochlorperazine (COMPAZINE) injection 10 mg (not administered)     Initial Impression / Assessment and Plan / ED Course  I have reviewed the triage vital signs and the nursing notes.  Pertinent labs & imaging results that were available during my care of the patient were reviewed by me and considered in my medical decision making (see chart for details).  11:18 PM Patient awake and alert, in no distress. She is almost done with fluid resuscitation, initial liter normal saline, liter #2, half-normal, with potassium, dextrose. Patient feels substantially better, has no ongoing nausea. On repeat evaluation, tongue appears more dry than thrush-like. Patient has GI follow-up in 3 days. With her substantial improvement, reassuring labs, no evidence for infection, patient appropriate for outpatient follow-up with her surgical team, GI as scheduled.   Final  Clinical Impressions(s) / ED Diagnoses  Nausea Weakness Pearline Cables, MD 11/03/16 [id number]

## 2016-11-03 NOTE — Discharge Instructions (Signed)
As discussed, your evaluation today has been largely reassuring.  But, it is important that you monitor your condition carefully, and do not hesitate to return to the ED if you develop new, or concerning changes in your condition. ? ?Otherwise, please follow-up with your physician for appropriate ongoing care. ? ?

## 2017-10-07 ENCOUNTER — Encounter (HOSPITAL_BASED_OUTPATIENT_CLINIC_OR_DEPARTMENT_OTHER): Payer: Self-pay

## 2017-10-07 ENCOUNTER — Other Ambulatory Visit: Payer: Self-pay

## 2017-10-07 ENCOUNTER — Emergency Department (HOSPITAL_BASED_OUTPATIENT_CLINIC_OR_DEPARTMENT_OTHER)
Admission: EM | Admit: 2017-10-07 | Discharge: 2017-10-07 | Disposition: A | Discharge status: Home / Self Care | Payer: Medicaid Other | Attending: Emergency Medicine | Admitting: Emergency Medicine

## 2017-10-07 DIAGNOSIS — R079 Chest pain, unspecified: Secondary | ICD-10-CM | POA: Insufficient documentation

## 2017-10-07 DIAGNOSIS — Z87891 Personal history of nicotine dependence: Secondary | ICD-10-CM | POA: Diagnosis not present

## 2017-10-07 DIAGNOSIS — Z9884 Bariatric surgery status: Secondary | ICD-10-CM | POA: Insufficient documentation

## 2017-10-07 DIAGNOSIS — E876 Hypokalemia: Secondary | ICD-10-CM | POA: Insufficient documentation

## 2017-10-07 DIAGNOSIS — F419 Anxiety disorder, unspecified: Secondary | ICD-10-CM | POA: Insufficient documentation

## 2017-10-07 HISTORY — DX: Panic disorder (episodic paroxysmal anxiety): F41.0

## 2017-10-07 LAB — BASIC METABOLIC PANEL
ANION GAP: 10 (ref 5–15)
BUN: 7 mg/dL (ref 6–20)
CHLORIDE: 104 mmol/L (ref 101–111)
CO2: 26 mmol/L (ref 22–32)
Calcium: 8.7 mg/dL — ABNORMAL LOW (ref 8.9–10.3)
Creatinine, Ser: 0.59 mg/dL (ref 0.44–1.00)
Glucose, Bld: 86 mg/dL (ref 65–99)
POTASSIUM: 2.8 mmol/L — AB (ref 3.5–5.1)
SODIUM: 140 mmol/L (ref 135–145)

## 2017-10-07 LAB — CBC
HEMATOCRIT: 36.9 % (ref 36.0–46.0)
Hemoglobin: 12.5 g/dL (ref 12.0–15.0)
MCH: 31.3 pg (ref 26.0–34.0)
MCHC: 33.9 g/dL (ref 30.0–36.0)
MCV: 92.3 fL (ref 78.0–100.0)
Platelets: 208 10*3/uL (ref 150–400)
RBC: 4 MIL/uL (ref 3.87–5.11)
RDW: 13.4 % (ref 11.5–15.5)
WBC: 6.2 10*3/uL (ref 4.0–10.5)

## 2017-10-07 LAB — TROPONIN I: Troponin I: <0.03 ng/mL (ref <0.03)

## 2017-10-07 MED ORDER — POTASSIUM CHLORIDE CRYS ER 20 MEQ PO TBCR
40.0000 meq | EXTENDED_RELEASE_TABLET | Freq: Once | ORAL | Status: AC
  Administered 2017-10-07: 40 meq via ORAL
  Filled 2017-10-07: qty 2

## 2017-10-07 MED ORDER — POTASSIUM CHLORIDE CRYS ER 20 MEQ PO TBCR: 20.0000 meq | EXTENDED_RELEASE_TABLET | Freq: Every day | ORAL | 0 refills | Status: AC

## 2017-10-07 MED FILL — POTASSIUM CL ER 20 MEQ TABL: 20 | 7 days supply | Qty: 7 | Fill #0

## 2017-10-07 NOTE — ED Triage Notes (Addendum)
C/o CP x today-NAD-steady gait-states she engaged in first time cocaine use last night-pt is anxious, crying

## 2017-10-07 NOTE — ED Provider Notes (Signed)
[facility] EMERGENCY DEPARTMENT Provider Note   CSN: [id number] Arrival date & time: 10/07/17  1146     History   Chief Complaint Chief Complaint  Patient presents with  . Chest Pain    HPI Sarah French is a 37 y.o. female who presents today for evaluation of chest pain.  She reports that she had about 1-2 hours of chest pain this morning.  Her chest pain was substernal, non radiating, and associated with shortness of breath, not N/V/D.  At the time of evaluation her CP has fully resolved.  She reports that she felt like it was a panic attack but was concerned because it was not getting better.  She reports that she tried snorting what she was told is cocaine last night for the first time.  She reports that she has been having increased stress and issues with self image recently.  She reports that she had excessive weight loss causing issues with body self image.  She denies recent illness or cough.    HPI  Past Medical History:  Diagnosis Date  . Anxiety   . Dry eyes   . Dyspnea    on exertion sometimes  . Hypoventilation associated with obesity syndrome (HCC)   . Migraines   . Morbidly obese (HCC)   . Obesity   . Pancreatitis 2006  . Panic attack   . Pinched nerve 2014   left leg    Patient Active Problem List   Diagnosis Date Noted  . Other seasonal allergic rhinitis 07/02/2015  . Sleep related headaches 06/04/2015  . Gasping for breath 06/04/2015  . Snoring 06/04/2015  . Morbid obesity due to excess calories (HCC) 06/04/2015  . Status post nail surgery 02/27/2015  . Ingrown nail 02/22/2015  . Pain in lower limb 02/22/2015    Past Surgical History:  Procedure Laterality Date  . CHOLECYSTECTOMY  2006  . EYE SURGERY     as infant and child  . FRACTURE SURGERY Left 2004   ankle  . 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 STRABISMUS SURGERY Right 07/23/2016   Procedure: REPAIR STRABISMUS RIGHT;  Surgeon: Verne Carrow, MD;  Location: [facility] OR;   Service: Ophthalmology;  Laterality: Right;  . widson teeth  2004    OB History    No data available       Home Medications    Prior to Admission medications   Medication Sig Start Date End Date Taking? Authorizing Provider  Multiple Vitamin (MULTIVITAMINS PO) Take by mouth.   Yes [provider]  Sucralfate (CARAFATE PO) Take by mouth.   Yes [provider]  omeprazole (PRILOSEC) 20 MG capsule Take 20 mg by mouth daily. 08/06/16   [provider]  potassium chloride SA (K-DUR,KLOR-CON) 20 MEQ tablet Take 1 tablet (20 mEq total) by mouth daily for 7 days. 10/07/17 10/14/17  Cristina Gong, PA-C    Family History Family History  Problem Relation Age of Onset  . Hypertension Mother   . Hypertension Father   . Arthritis Sister   . Migraines Maternal Grandmother     Social History Social History   Tobacco Use  . Smoking status: Former Smoker    Types: Cigarettes    Last attempt to quit: 2010    Years since quitting: 9.1  . Smokeless tobacco: Never Used  Substance Use Topics  . Alcohol use: No    Alcohol/week: 0.0 oz    Frequency: Never  . Drug use: No  Allergies   Nsaids; Topiramate; and Zithromax [azithromycin]   Review of Systems Review of Systems  Constitutional: Negative for chills and fever.  HENT: Negative for congestion.   Eyes: Negative for visual disturbance.       History of strabismus  Respiratory: Negative for cough, chest tightness and shortness of breath.   Cardiovascular: Positive for chest pain. Negative for palpitations and leg swelling.  Gastrointestinal: Negative for abdominal pain, rectal pain and vomiting.  Skin: Negative for rash.  Neurological: Negative for dizziness and headaches.  All other systems reviewed and are negative.    Physical Exam Updated Vital Signs BP 128/85   Pulse 82   Temp 98.1 F (36.7 C) (Oral)   Resp 19   Ht 5\' 4"  (1.626 m)   Wt 66.9 kg (147 lb 7.8 oz)   LMP 09/23/2017    SpO2 100%   BMI 25.32 kg/m   Physical Exam  Constitutional: She appears well-developed and well-nourished. No distress.  HENT:  Head: Normocephalic and atraumatic.  Eyes: Conjunctivae are normal. Pupils are equal, round, and reactive to light. Right eye exhibits no discharge. Left eye exhibits no discharge. No scleral icterus.  Neck: Normal range of motion.  Cardiovascular: Normal rate, regular rhythm, intact distal pulses and normal pulses.  Pulmonary/Chest: Effort normal and breath sounds normal. No stridor. No respiratory distress. She has no decreased breath sounds.  Abdominal: She exhibits no distension.  Musculoskeletal: She exhibits no edema or deformity.       Right lower leg: Normal. She exhibits no edema.       Left lower leg: Normal. She exhibits no edema.  Neurological: She is alert. She exhibits normal muscle tone.  Skin: Skin is warm and dry. She is not diaphoretic.  Psychiatric: Her behavior is normal. Her mood appears anxious. She is not agitated.  Nursing note and vitals reviewed.    ED Treatments / Results  Labs (all labs ordered are listed, but only abnormal results are displayed) Labs Reviewed  BASIC METABOLIC PANEL - Abnormal; Notable for the following components:      Result Value   Potassium 2.8 (*)    Calcium 8.7 (*)    All other components within normal limits  CBC  TROPONIN I    EKG  EKG Interpretation  Date/Time:  Thursday October 07 2017 12:15:32 EST Ventricular Rate:  90 PR Interval:  176 QRS Duration: 74 QT Interval:  368 QTC Calculation: 450 R Axis:   60 Text Interpretation:  Normal sinus rhythm Septal infarct , age undetermined Abnormal ECG No significant change since last tracing Confirmed by Frederick PeersLittle, Rachel [phone]) on 10/07/2017 1:30:30 PM       Radiology No results found.  Procedures Procedures (including critical care time)  Medications Ordered in ED Medications  potassium chloride SA (K-DUR,KLOR-CON) CR tablet 40 mEq (40  mEq Oral Given 10/07/17 1557)     Initial Impression / Assessment and Plan / ED Course  I have reviewed the triage vital signs and the nursing notes.  Pertinent labs & imaging results that were available during my care of the patient were reviewed by me and considered in my medical decision making (see chart for details).    Patient is to be discharged with recommendation to follow up with PCP in regards to today's hospital visit. Chest pain is not likely of cardiac or pulmonary etiology d/t presentation, PERC negative, VSS, no tracheal deviation, no JVD or new murmur, RRR, breath sounds equal bilaterally, EKG without acute abnormalities, negative  troponin, and negative CXR. Pt has been advised to return to the ED if CP becomes exertional, associated with diaphoresis or nausea, radiates to left jaw/arm, worsens or becomes concerning in any way. Pt appears reliable for follow up and is agreeable to discharge.  Given potassium in the ED, rx for at home.    Case has been discussed with  Dr. Clarene Duke who agrees with the above plan to discharge.     Final Clinical Impressions(s) / ED Diagnoses   Final diagnoses:  Hypokalemia  Chest pain, unspecified type  Anxiety    ED Discharge Orders        Ordered    potassium chloride SA (K-DUR,KLOR-CON) 20 MEQ tablet  Daily     10/07/17 1613       Cristina Gong, [place] 10/07/17 1737    Little, Ambrose Finland, MD 10/08/17 0800

## 2017-10-07 NOTE — ED Notes (Signed)
ED Provider at bedside. 

## 2017-10-07 NOTE — Discharge Instructions (Signed)
Please make sure you are eating enough potassium.

## 2018-06-13 IMAGING — RF DG UGI W/ KUB
6 series · 14 of 24 positions shown · non-contrast
Comparison: Barium swallow of 01/23/2016

CLINICAL DATA: Dysphagia, gastric bypass surgery 3-4 weeks ago for
morbid obesity

EXAM:
UPPER GI SERIES WITH KUB
TECHNIQUE: After obtaining a scout radiograph a routine upper GI series was
performed using thin barium
FLUOROSCOPY TIME:  Fluoroscopy Time:  1 minutes 36 seconds
Radiation Exposure Index (if provided by the fluoroscopic device):
435 mGy
Number of Acquired Spot Images: 0

[Series 1: one shot · 1 of 1 slices shown (1 of 2)]
[im 1/1]
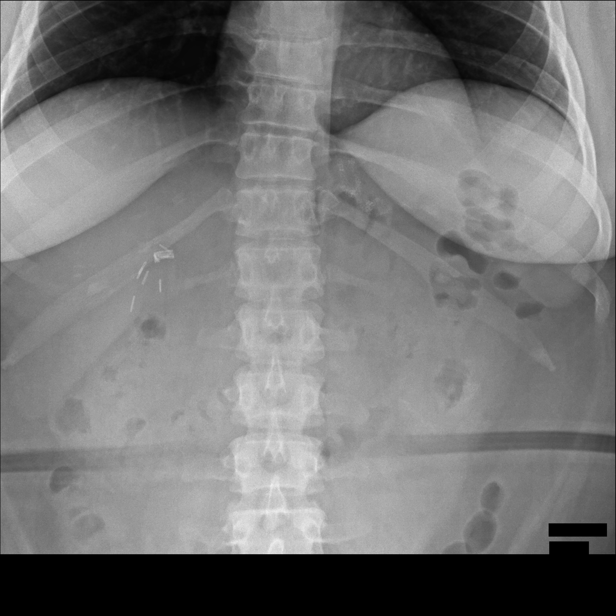

[Series 2: sequence · 1 of 27 frames shown (1 of 4)]
[frame 14/27]
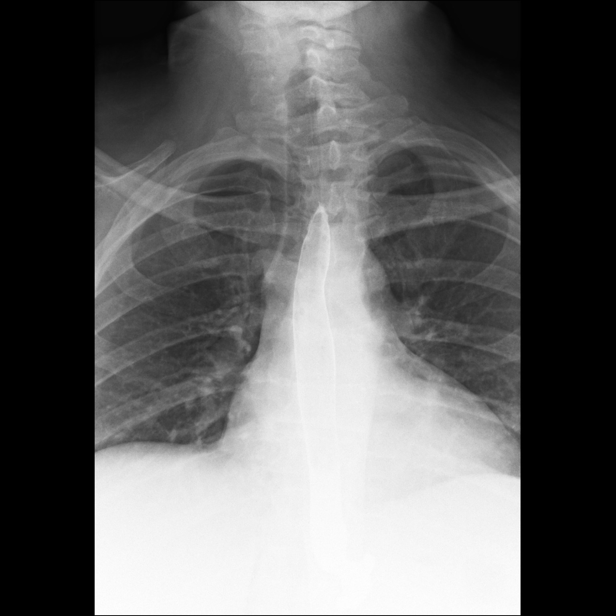

[Series 3: sequence · 3 of 11 frames shown (2 of 4)]
[frame 2/11]
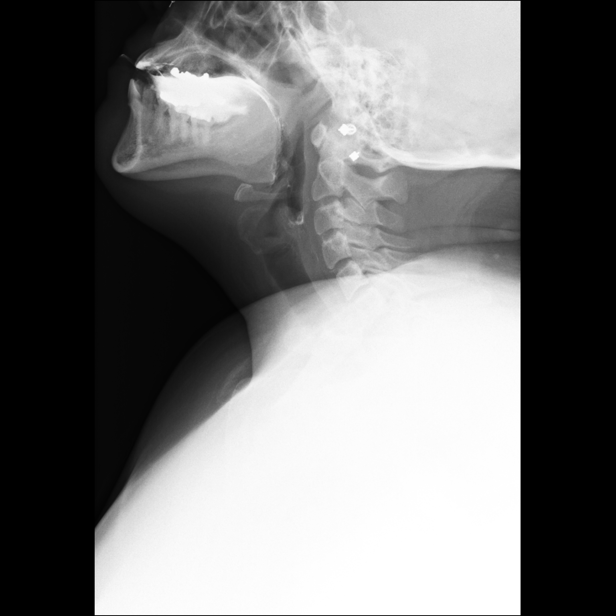
[frame 10/11]
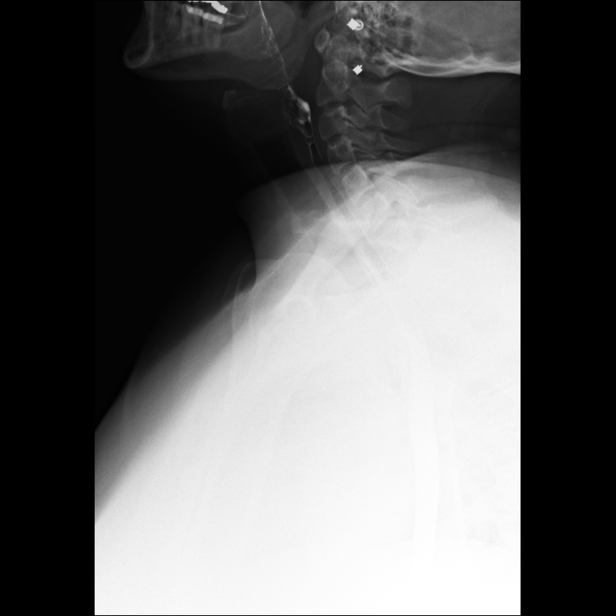
[frame 11/11]
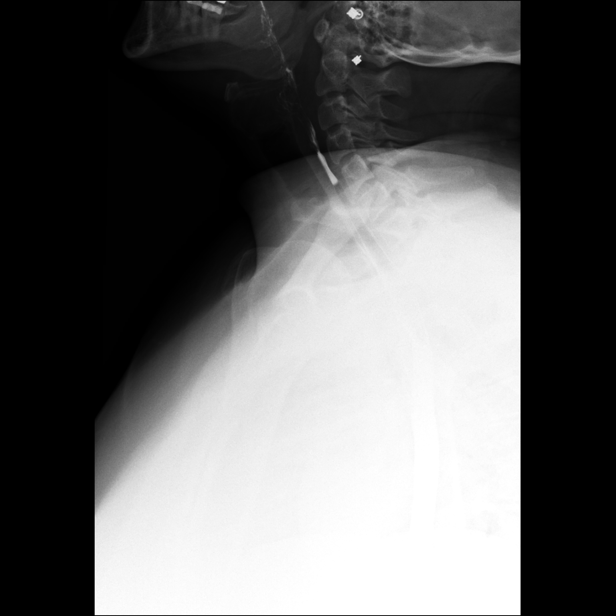

[Series 4: sequence · 2 of 10 frames shown (3 of 4)]
[frame 6/10]
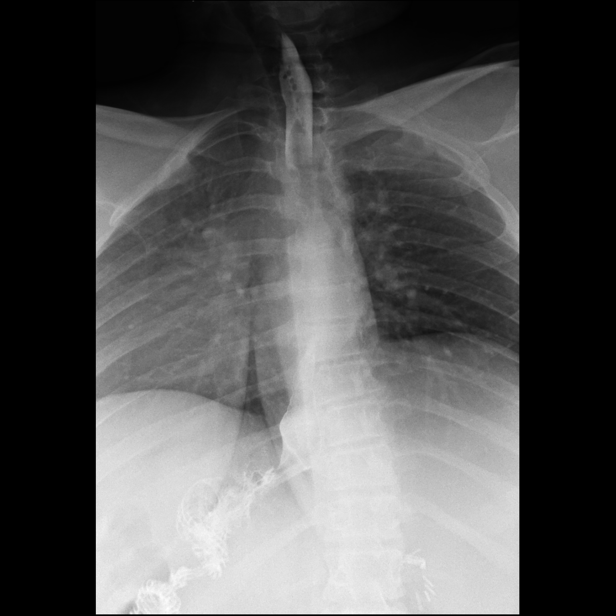
[frame 10/10]
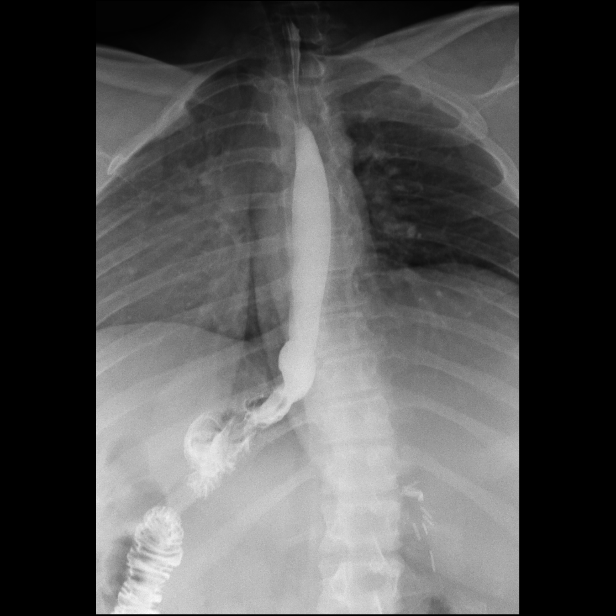

[Series 5: sequence · 2 of 9 frames shown (4 of 4)]
[frame 2/9]
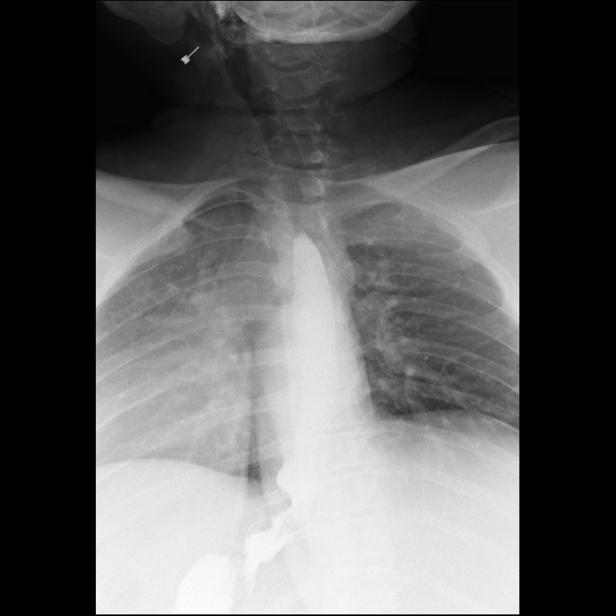
[frame 8/9]
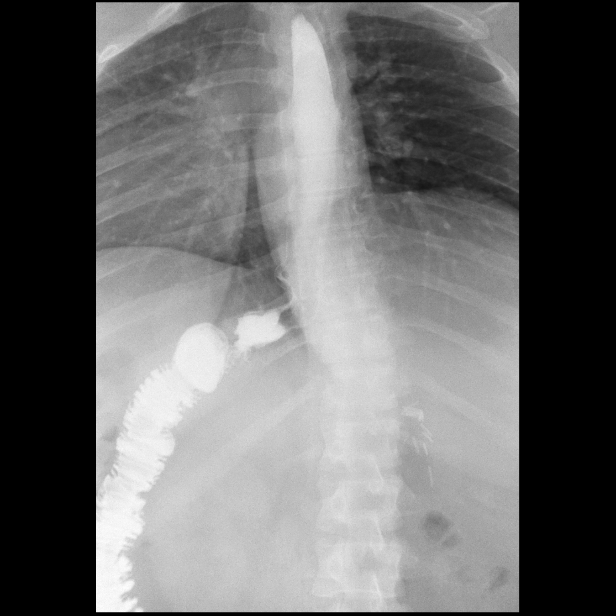

[Series 6: one shot · 5 of 9 slices shown (2 of 2)]
[im 2/9]
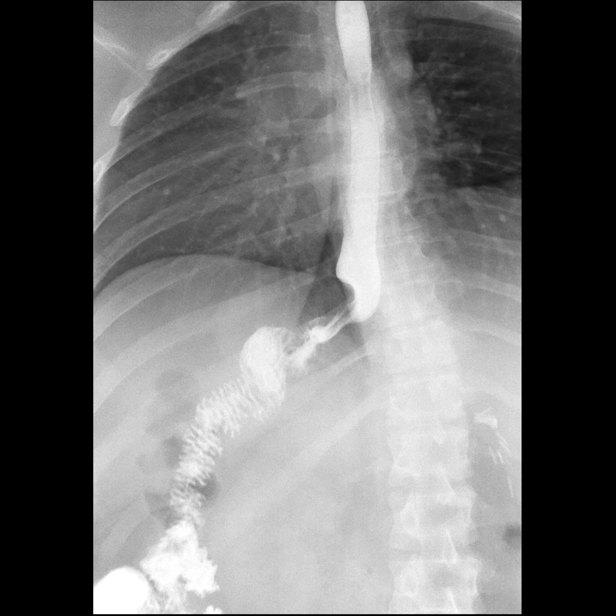
[im 4/9]
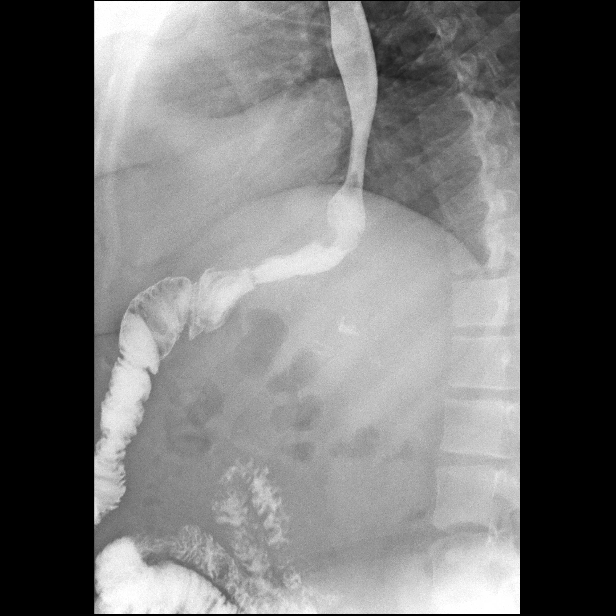
[im 5/9]
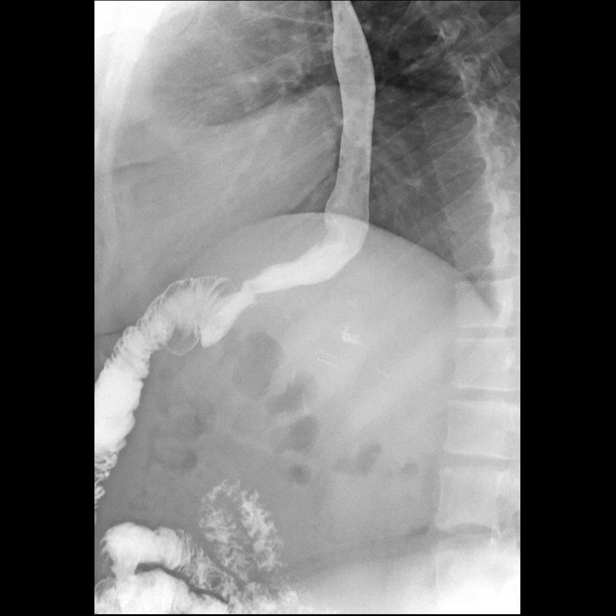
[im 7/9]
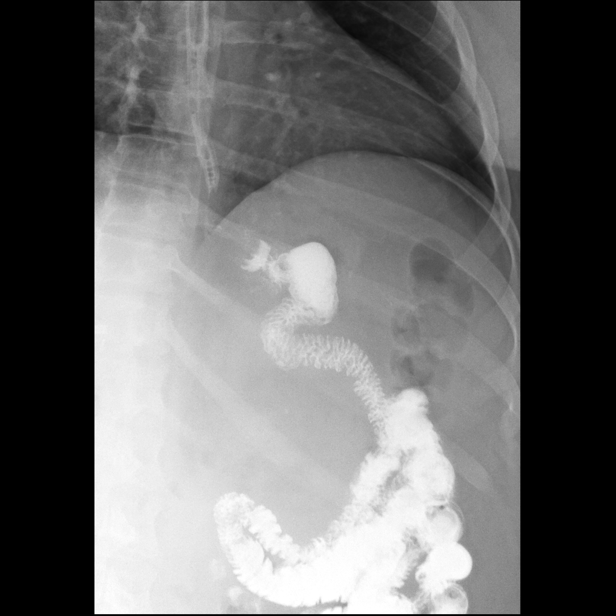
[im 9/9]
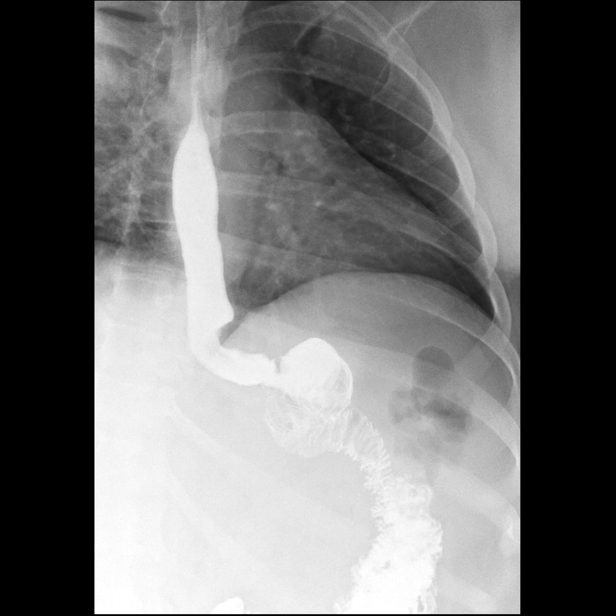

[14 of 24 positions shown; findings below may reference images not displayed]

FINDINGS: A preliminary film of the abdomen shows surgical clips in the right
upper quadrant prior cholecystectomy. Staples are noted in the left
upper quadrant medially after gastric bypass surgery. The bowel gas
pattern is nonspecific.

Rapid sequence spot films of the cervical esophagus show the
swallowing mechanism to be normal. No abnormality of the cervical
esophagus is seen. Esophageal peristalsis is normal. The extrinsic
appearing defect in the distal esophagus is not as well seen as on
the prior upper GI but does persist. Evidently this area was
biopsied recently. No gastroesophageal reflux is evident. Only a
small gastric pouch is noted. Barium flows freely from the small
gastric pouch into the proximal small bowel without complication or
obstruction.
IMPRESSION: 1. No abnormality of the cervical esophagus is seen. No
gastroesophageal reflux is demonstrated.
2. Small gastric pouch with no obstruction to the passage of barium
into the small bowel.
3. The defect within the distal esophagus is not as well seen as on
the prior study, but does persist. Apparently this area has been
biopsied recently.

## 2020-01-11 ENCOUNTER — Other Ambulatory Visit: Payer: Self-pay

## 2020-01-11 ENCOUNTER — Emergency Department (HOSPITAL_COMMUNITY)
Admission: EM | Admit: 2020-01-11 | Discharge: 2020-01-11 | Disposition: A | Discharge status: Home / Self Care | Payer: Medicaid Other | Attending: Emergency Medicine | Admitting: Emergency Medicine

## 2020-01-11 DIAGNOSIS — S060X0A Concussion without loss of consciousness, initial encounter: Secondary | ICD-10-CM | POA: Insufficient documentation

## 2020-01-11 DIAGNOSIS — M542 Cervicalgia: Secondary | ICD-10-CM | POA: Insufficient documentation

## 2020-01-11 DIAGNOSIS — M25511 Pain in right shoulder: Secondary | ICD-10-CM | POA: Diagnosis not present

## 2020-01-11 DIAGNOSIS — M25512 Pain in left shoulder: Secondary | ICD-10-CM | POA: Insufficient documentation

## 2020-01-11 DIAGNOSIS — Y9389 Activity, other specified: Secondary | ICD-10-CM | POA: Insufficient documentation

## 2020-01-11 DIAGNOSIS — Y92410 Unspecified street and highway as the place of occurrence of the external cause: Secondary | ICD-10-CM | POA: Diagnosis not present

## 2020-01-11 DIAGNOSIS — Z79899 Other long term (current) drug therapy: Secondary | ICD-10-CM | POA: Insufficient documentation

## 2020-01-11 DIAGNOSIS — Y999 Unspecified external cause status: Secondary | ICD-10-CM | POA: Insufficient documentation

## 2020-01-11 DIAGNOSIS — S0990XA Unspecified injury of head, initial encounter: Secondary | ICD-10-CM | POA: Insufficient documentation

## 2020-01-11 DIAGNOSIS — Z87891 Personal history of nicotine dependence: Secondary | ICD-10-CM | POA: Diagnosis not present

## 2020-01-11 MED ORDER — METHOCARBAMOL 500 MG PO TABS: 500.0000 mg | ORAL_TABLET | Freq: Two times a day (BID) | ORAL | 0 refills | Status: DC

## 2020-01-11 MED ORDER — METHOCARBAMOL 500 MG PO TABS: 500.0000 mg | ORAL_TABLET | Freq: Two times a day (BID) | ORAL | 0 refills | Status: AC
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
## 2020-01-11 NOTE — Discharge Instructions (Addendum)
Take over the counter Tylenol as needed for the next week. Take this medicine with food. Take muscle relaxer at bedtime to help you sleep. This medicine makes you drowsy so do not take before driving or work Use a heating pad for sore muscles - use for 20 minutes several times a day Try gentle range of motion exercises Return for worsening symptoms- which includes excessive sleepiness, nausea, vomiting, change in mental status. Please read concussion informational sheet.  Please follow up with your primary care provider if symptoms do not improve

## 2022-07-14 ENCOUNTER — Other Ambulatory Visit: Payer: Self-pay | Admitting: Specialist

## 2022-07-14 DIAGNOSIS — Z1231 Encounter for screening mammogram for malignant neoplasm of breast: Secondary | ICD-10-CM
